# Patient Record
Sex: Female | Born: 1965 | Race: Black or African American | Hispanic: No | Marital: Married | Smoking: Never smoker
Health system: Southern US, Community
[De-identification: ages and names within clinical notes are randomized; demographics above are authoritative.]

## PROBLEM LIST (undated history)

## (undated) DIAGNOSIS — I509 Heart failure, unspecified: Secondary | ICD-10-CM

## (undated) DIAGNOSIS — I429 Cardiomyopathy, unspecified: Secondary | ICD-10-CM

## (undated) DIAGNOSIS — E7800 Pure hypercholesterolemia, unspecified: Secondary | ICD-10-CM

## (undated) HISTORY — PX: ABDOMINAL HYSTERECTOMY: SHX81

## (undated) HISTORY — PX: TUBAL LIGATION: SHX77

---

## 1998-03-18 ENCOUNTER — Other Ambulatory Visit: Admission: RE | Admit: 1998-03-18 | Discharge: 1998-03-18 | Payer: Self-pay | Admitting: *Deleted

## 1999-08-24 ENCOUNTER — Other Ambulatory Visit: Admission: RE | Admit: 1999-08-24 | Discharge: 1999-08-24 | Payer: Self-pay | Admitting: *Deleted

## 2000-02-13 ENCOUNTER — Encounter: Payer: Self-pay | Admitting: Emergency Medicine

## 2000-02-13 ENCOUNTER — Emergency Department (HOSPITAL_COMMUNITY): Admission: EM | Admit: 2000-02-13 | Discharge: 2000-02-13 | Payer: Self-pay | Admitting: Emergency Medicine

## 2001-09-08 ENCOUNTER — Other Ambulatory Visit: Admission: RE | Admit: 2001-09-08 | Discharge: 2001-09-08 | Payer: Self-pay | Admitting: Obstetrics and Gynecology

## 2002-08-06 ENCOUNTER — Other Ambulatory Visit: Admission: RE | Admit: 2002-08-06 | Discharge: 2002-08-06 | Payer: Self-pay | Admitting: Obstetrics and Gynecology

## 2002-09-10 ENCOUNTER — Encounter: Admission: RE | Admit: 2002-09-10 | Discharge: 2002-09-10 | Payer: Self-pay | Admitting: Obstetrics and Gynecology

## 2002-09-10 ENCOUNTER — Encounter: Payer: Self-pay | Admitting: Obstetrics and Gynecology

## 2006-02-12 ENCOUNTER — Ambulatory Visit (HOSPITAL_COMMUNITY): Admission: RE | Admit: 2006-02-12 | Discharge: 2006-02-12 | Payer: Self-pay | Admitting: Obstetrics & Gynecology

## 2008-01-07 ENCOUNTER — Ambulatory Visit (HOSPITAL_COMMUNITY): Admission: RE | Admit: 2008-01-07 | Discharge: 2008-01-07 | Payer: Self-pay | Admitting: Obstetrics & Gynecology

## 2010-05-16 ENCOUNTER — Ambulatory Visit (HOSPITAL_COMMUNITY)
Admission: RE | Admit: 2010-05-16 | Discharge: 2010-05-16 | Payer: Self-pay | Source: Home / Self Care | Attending: Obstetrics & Gynecology | Admitting: Obstetrics & Gynecology

## 2010-06-21 ENCOUNTER — Encounter
Admission: RE | Admit: 2010-06-21 | Discharge: 2010-06-21 | Payer: Self-pay | Source: Home / Self Care | Attending: Obstetrics & Gynecology | Admitting: Obstetrics & Gynecology

## 2010-07-01 ENCOUNTER — Encounter: Payer: Self-pay | Admitting: Obstetrics & Gynecology

## 2010-09-07 ENCOUNTER — Other Ambulatory Visit: Payer: Self-pay | Admitting: Obstetrics & Gynecology

## 2010-09-07 ENCOUNTER — Encounter (HOSPITAL_COMMUNITY): Payer: BC Managed Care – PPO | Attending: Obstetrics & Gynecology

## 2010-09-07 DIAGNOSIS — D259 Leiomyoma of uterus, unspecified: Secondary | ICD-10-CM | POA: Insufficient documentation

## 2010-09-07 DIAGNOSIS — Z01812 Encounter for preprocedural laboratory examination: Secondary | ICD-10-CM | POA: Insufficient documentation

## 2010-09-07 DIAGNOSIS — Z01810 Encounter for preprocedural cardiovascular examination: Secondary | ICD-10-CM | POA: Insufficient documentation

## 2010-09-07 LAB — CBC
HCT: 36.2 % (ref 36.0–46.0)
MCH: 27.9 pg (ref 26.0–34.0)
MCV: 83.6 fL (ref 78.0–100.0)
Platelets: 188 10*3/uL (ref 150–400)
RDW: 13 % (ref 11.5–15.5)

## 2010-09-15 ENCOUNTER — Observation Stay (HOSPITAL_COMMUNITY)
Admission: RE | Admit: 2010-09-15 | Discharge: 2010-09-16 | Disposition: A | Discharge status: Home / Self Care | Payer: BC Managed Care – PPO | Source: Ambulatory Visit | Attending: Obstetrics & Gynecology | Admitting: Obstetrics & Gynecology

## 2010-09-15 ENCOUNTER — Other Ambulatory Visit: Payer: Self-pay | Admitting: Obstetrics & Gynecology

## 2010-09-15 DIAGNOSIS — N72 Inflammatory disease of cervix uteri: Secondary | ICD-10-CM | POA: Insufficient documentation

## 2010-09-15 DIAGNOSIS — Z01811 Encounter for preprocedural respiratory examination: Secondary | ICD-10-CM | POA: Insufficient documentation

## 2010-09-15 DIAGNOSIS — N946 Dysmenorrhea, unspecified: Secondary | ICD-10-CM | POA: Insufficient documentation

## 2010-09-15 DIAGNOSIS — Z01812 Encounter for preprocedural laboratory examination: Secondary | ICD-10-CM | POA: Insufficient documentation

## 2010-09-15 DIAGNOSIS — N949 Unspecified condition associated with female genital organs and menstrual cycle: Secondary | ICD-10-CM | POA: Insufficient documentation

## 2010-09-15 DIAGNOSIS — D259 Leiomyoma of uterus, unspecified: Principal | ICD-10-CM | POA: Insufficient documentation

## 2010-09-15 DIAGNOSIS — G43909 Migraine, unspecified, not intractable, without status migrainosus: Secondary | ICD-10-CM | POA: Insufficient documentation

## 2010-09-15 LAB — ABO/RH: ABO/RH(D): B POS

## 2010-09-15 LAB — TYPE AND SCREEN
ABO/RH(D): B POS
Antibody Screen: NEGATIVE

## 2010-09-16 LAB — CBC
HCT: 31.1 % — ABNORMAL LOW (ref 36.0–46.0)
MCV: 83.8 fL (ref 78.0–100.0)
Platelets: 192 10*3/uL (ref 150–400)
RBC: 3.71 MIL/uL — ABNORMAL LOW (ref 3.87–5.11)
RDW: 13 % (ref 11.5–15.5)
WBC: 19.7 10*3/uL — ABNORMAL HIGH (ref 4.0–10.5)

## 2010-09-16 NOTE — Op Note (Signed)
Katie Crawford, ABREU NO.:  [id number]  MEDICAL RECORD NO.:  [id number]           PATIENT TYPE:  O  LOCATION:  1526                         FACILITY:  [facility]  PHYSICIAN:  Roseanna Rainbow, M.D.DATE OF BIRTH:  1965/06/21  DATE OF PROCEDURE:  09/15/2010 DATE OF DISCHARGE:                              OPERATIVE REPORT   PREOPERATIVE DIAGNOSIS:  Symptomatic uterine fibroids.  POSTOPERATIVE DIAGNOSIS:  Symptomatic uterine fibroids.  PROCEDURE:  Robotic-assisted total laparoscopic hysterectomy.  ANESTHESIA:  General endotracheal.  ESTIMATED BLOOD LOSS:  100 cc.  FLUIDS:  As per Anesthesiology.  URINE OUTPUT:  As per Anesthesiology.  COMPLICATIONS:  None.  SURGEON:  Roseanna Rainbow, MD, and Bing Neighbors. Clearance Coots, MD  FINDINGS:  On exam under anesthesia, the uterus was anteverted; the uterus sounded to 10 cm.  At the time of laparoscopy, there was an omental adhesion to the anterior abdominal wall.  There was a right-sided, lower uterine segment anterior myoma approximately 6 cm in diameter.  The fallopian tubes had been interrupted.  The ovaries were normal in appearance.  PROCEDURE IN DETAIL:  The patient was taken to the operating room and placed in the supine position with the arms tucked.  General anesthesia was then induced and she was placed in the dorsal supine position in Fowlkes stirrups with all the appropriate precautions.  Shoulder blocks were then placed.  The abdomen was prepped in the usual sterile fashion. A time-out was performed to confirm the patient, procedure, antibiotic, and allergy status.  The perineum and vagina were prepped with Betadine. A Foley catheter was placed in the vagina.  A sterile speculum was placed in the vagina.  The anterior lip of the cervix was then grasped with a single-tooth tenaculum.  The cervix was then dilated with [facility] dilators.  The ZUMI uterine manipulator with a medium colpotomizer ring was then  placed.  A pneumo-occluder was then placed over the uterine manipulator.  An OG tube was placed by Anesthesiology.  A 0.25% Marcaine was then injected 3 cm below the costal margin in the midclavicular line in the left upper quadrant.  Using a 5 mm Optiview, the abdomen was then entered under direct visualization.  The abdomen was then insufflated with CO2 gas.  The patient's abdominal pressures were appropriately maintained throughout the surgery.  She was then placed in steep Trendelenburg.  The incision sites were marked.  A 12 mm supraumbilical port was placed.  Bilateral 8 mm ports were placed 10 cm lateral from the camera port.  The left upper quadrant port was then removed and the incision was extended to accommodate a 10 mm trocar and sleeve which was advanced under direct visualization.  The above noted omental adhesion was divided with endoshears.   The robot was then docked in the usual fashion.  Starting on the patient's right, the round ligament was transected.  The anterior and posterior leaflets of the broad ligament were opened and a window was made in the broad ligament and a fallopian tube was cauterized and transected.  The utero-ovarian ligament was then cauterized and transected.  The bladder flap was created anteriorly from the patient's right  side.  The uterine vessels were then skeletonized and transected.  A C-loop was created in the usual fashion.  Attention was then turned to left side.  The round ligament was then transected with cautery.  The anterior and posterior leaflets of the broad ligament were opened, the bladder flap was then completed anteriorly.  A window was made in the broad ligament, fallopian tube was then cauterized, and transected.  The utero-ovarian ligament was then cauterized and transected.  Adequate hemostasis was noted.  The uterine vessels were skeletonized.  The uterine vessels were then coagulated with bipolar cautery and transected.   The pneumo- occluder balloon was then insufflated.  A C-loop on the patient's left side was then created in the usual fashion using monopolar cautery.  A colpotomy was started at 12 o'clock and continued around to 5 o'clock. The uterus was then deviated to the patient's right side.  The colpotomy was completed using monopolar cautery.  The uterus was then delivered into the vagina.  A suture cut needle guide was then inserted and using 0 Vicryl suture, the vaginal cuff was closed using a running suture. The abdomen and pelvis were then irrigated.  All the pedicles were inspected and were noted to be hemostatic.  The robot was undocked in the usual fashion.  The subcutaneous layers where the incisions were then reapproximated using figure-of-eight sutures of 0 Vicryl on a UR-6 needle.  The skin incisions were then closed using subcuticular sutures of 3-0 Monocryl.  The pneumo-occluder balloon was removed from the vagina.  Sponge stick was used to swab the vagina.  There was no active bleeding noted.  All the instrument and needle counts were correct x2 including the colpotomy ring.  The patient tolerated the procedure well and was taken to the PACU awake and in stable condition.     Roseanna Rainbow, M.D.     Judee Clara  D:  09/15/2010  T:  09/16/2010  Job:  [id number]  Electronically Signed by Antionette Char M.D. on 09/16/2010 04:54:09 PM

## 2010-09-16 NOTE — H&P (Signed)
  NAMEMarland Kitchen  SWAN, [name] NO.:  [id number]  MEDICAL RECORD NO.:  [id number]           PATIENT TYPE:  LOCATION:                                 FACILITY:  PHYSICIAN:  Katie Crawford, M.D.DATE OF BIRTH:  Feb 06, 1966  DATE OF ADMISSION: DATE OF DISCHARGE:                             HISTORY & PHYSICAL   CHIEF COMPLAINTS:  The patient is a 45 year old with symptomatic uterine fibroids and dysmenorrhea who presents for a total robotic assisted hysterectomy.  HISTORY OF PRESENT ILLNESS:  The patient complains of pain with intercourse associated with deep penetration.  Workup today has included a pelvic ultrasound that showed uterus size of length of 9.68 cm and multiple small fibroids, the largest fibroid is right sided, 5 cm in diameter.  A recent Pap smear was negative in December of 2011.  The patient also had a consult with Interventional Radiology for possible uterine artery embolization.  The patient has a history of irregular menses.   Workup to date has included a CBC, complete metabolic panel, estradiol which was 26.2, FSH which was 36.4.  The prolactin was normal.  PAST MEDICAL HISTORY:  No significant history of medical diseases.  PAST SURGICAL HISTORY:  There is a history of tubal ligation.  PAST OBSTETRICAL HISTORY:  There are 3 living children.  C/D x 2.  SOCIAL HISTORY:  She is married, living with her spouse.  She is a day Occupational hygienist.  There is no significant smoking history.  Does not give any significant history of alcohol usage.  Denies illicit drug use.  FAMILY HISTORY:  No major illnesses known.  MEDICATIONS:  Ultram.  REVIEW OF SYSTEMS:  GU:  Please see the above.  PHYSICAL EXAMINATION:  VITAL SIGNS:  Temperature 98.1, pulse 71, blood pressure 128/84.  Weight 171 pounds. GENERAL:  Well-developed, well-nourished African-American female, in no apparent distress. NECK:  Supple. LUNGS:  Clear to auscultation bilaterally. HEART:   Regular rate and rhythm. ABDOMEN:  No organomegaly. PELVIC EXAM:  Normal exit.  Speculum exam, the vagina is clear without lesions.  The cervix is clear, firm and closed.  No visible leakage.  On bimanual exam, uterus is mildly enlarged.  Position is anteverted, shape is irregular, about 10 week aggregate size.  Adnexa are without any masses and mildly tender.  ASSESSMENT:  Symptomatic uterine fibroid, perimenopausal patient with irregular menses, pelvic pain.  PLAN: The planned procedure is a total robotic assisted hysterectomy with ovarian conservation.  The risks, benefits, and alternative forms of the management were reviewed with the patient including but not limited to risk of infection, hemorrhage, inadvertent injury to the abdominopelvic viscera and abdominal and bowel complications.     Katie Crawford, M.D.     Katie Crawford  D:  09/13/2010  T:  09/14/2010  Job:  [id number]  Electronically Signed by Antionette Char M.D. on 09/16/2010 02:06:48 PM

## 2011-05-14 ENCOUNTER — Other Ambulatory Visit: Payer: Self-pay | Admitting: Obstetrics & Gynecology

## 2011-05-14 DIAGNOSIS — Z1231 Encounter for screening mammogram for malignant neoplasm of breast: Secondary | ICD-10-CM

## 2011-05-18 ENCOUNTER — Ambulatory Visit
Admission: RE | Admit: 2011-05-18 | Discharge: 2011-05-18 | Disposition: A | Discharge status: Home / Self Care | Payer: BC Managed Care – PPO | Source: Ambulatory Visit | Attending: Obstetrics & Gynecology | Admitting: Obstetrics & Gynecology

## 2011-05-18 DIAGNOSIS — Z1231 Encounter for screening mammogram for malignant neoplasm of breast: Secondary | ICD-10-CM

## 2011-11-23 ENCOUNTER — Encounter (HOSPITAL_COMMUNITY): Payer: Self-pay | Admitting: Family Medicine

## 2011-11-23 ENCOUNTER — Observation Stay (HOSPITAL_COMMUNITY)
Admission: EM | Admit: 2011-11-23 | Discharge: 2011-11-25 | DRG: 143 | Disposition: A | Discharge status: Home / Self Care | Payer: BC Managed Care – PPO | Attending: Internal Medicine | Admitting: Internal Medicine

## 2011-11-23 DIAGNOSIS — R079 Chest pain, unspecified: Secondary | ICD-10-CM | POA: Diagnosis present

## 2011-11-23 DIAGNOSIS — Z79890 Hormone replacement therapy (postmenopausal): Secondary | ICD-10-CM

## 2011-11-23 DIAGNOSIS — E7800 Pure hypercholesterolemia, unspecified: Secondary | ICD-10-CM | POA: Insufficient documentation

## 2011-11-23 DIAGNOSIS — R0789 Other chest pain: Secondary | ICD-10-CM | POA: Insufficient documentation

## 2011-11-23 DIAGNOSIS — R071 Chest pain on breathing: Principal | ICD-10-CM | POA: Insufficient documentation

## 2011-11-23 HISTORY — DX: Heart failure, unspecified: I50.9

## 2011-11-23 HISTORY — DX: Cardiomyopathy, unspecified: I42.9

## 2011-11-23 HISTORY — DX: Pure hypercholesterolemia, unspecified: E78.00

## 2011-11-23 LAB — CBC
HCT: 37.5 % (ref 36.0–46.0)
HCT: 35.7 % — ABNORMAL LOW (ref 36.0–46.0)
Hemoglobin: 12.9 g/dL (ref 12.0–15.0)
Hemoglobin: 12.3 g/dL (ref 12.0–15.0)
MCH: 28.5 pg (ref 26.0–34.0)
MCH: 28.8 pg (ref 26.0–34.0)
MCHC: 34.4 g/dL (ref 30.0–36.0)
MCHC: 34.5 g/dL (ref 30.0–36.0)
MCV: 82.8 fL (ref 78.0–100.0)
MCV: 83.6 fL (ref 78.0–100.0)
Platelets: 210 10*3/uL (ref 150–400)
Platelets: 190 10*3/uL (ref 150–400)
RBC: 4.53 MIL/uL (ref 3.87–5.11)
RBC: 4.27 MIL/uL (ref 3.87–5.11)
RDW: 12.6 % (ref 11.5–15.5)
RDW: 12.9 % (ref 11.5–15.5)
WBC: 9.7 10*3/uL (ref 4.0–10.5)
WBC: 8.7 10*3/uL (ref 4.0–10.5)

## 2011-11-23 LAB — BASIC METABOLIC PANEL
BUN: 13 mg/dL (ref 6–23)
CO2: 26 mEq/L (ref 19–32)
Calcium: 9.9 mg/dL (ref 8.4–10.5)
Chloride: 101 mEq/L (ref 96–112)
Creatinine, Ser: 0.72 mg/dL (ref 0.50–1.10)
GFR calc Af Amer: >90 mL/min (ref >90)
GFR calc non Af Amer: >90 mL/min (ref >90)
Glucose, Bld: 120 mg/dL — ABNORMAL HIGH (ref 70–99)
Potassium: 3.5 mEq/L (ref 3.5–5.1)
Sodium: 137 mEq/L (ref 135–145)

## 2011-11-23 LAB — POCT I-STAT TROPONIN I: Troponin i, poc: 0 ng/mL (ref 0.00–0.08)

## 2011-11-23 LAB — CREATININE, SERUM
Creatinine, Ser: 0.72 mg/dL (ref 0.50–1.10)
GFR calc Af Amer: >90 mL/min (ref >90)
GFR calc non Af Amer: >90 mL/min (ref >90)

## 2011-11-23 LAB — CARDIAC PANEL(CRET KIN+CKTOT+MB+TROPI): Total CK: 146 U/L (ref 7–177)

## 2011-11-23 LAB — PRO B NATRIURETIC PEPTIDE: Pro B Natriuretic peptide (BNP): 18.9 pg/mL (ref 0–125)

## 2011-11-23 MED ORDER — ACETAMINOPHEN 650 MG RE SUPP: 650.0000 mg | Freq: Four times a day (QID) | RECTAL | Status: DC | PRN

## 2011-11-23 MED ORDER — ATORVASTATIN CALCIUM 10 MG PO TABS
10.0000 mg | ORAL_TABLET | Freq: Every day | ORAL | Status: DC
  Administered 2011-11-24 – 2011-11-25 (×2): 10 mg via ORAL
  Filled 2011-11-23 (×2): qty 1

## 2011-11-23 MED ORDER — MORPHINE SULFATE 2 MG/ML IJ SOLN
1.0000 mg | INTRAMUSCULAR | Status: DC | PRN
  Administered 2011-11-23: 1 mg via INTRAVENOUS
  Filled 2011-11-23: qty 1

## 2011-11-23 MED ORDER — ACETAMINOPHEN 325 MG PO TABS
650.0000 mg | ORAL_TABLET | Freq: Four times a day (QID) | ORAL | Status: DC | PRN
  Administered 2011-11-24 (×2): 650 mg via ORAL
  Filled 2011-11-23 (×2): qty 2

## 2011-11-23 MED ORDER — ONDANSETRON HCL 4 MG/2ML IJ SOLN: 4.0000 mg | Freq: Four times a day (QID) | INTRAMUSCULAR | Status: DC | PRN

## 2011-11-23 MED ORDER — NITROGLYCERIN 0.3 MG SL SUBL
0.3000 mg | SUBLINGUAL_TABLET | SUBLINGUAL | Status: DC | PRN
  Filled 2011-11-23: qty 100

## 2011-11-23 MED ORDER — MORPHINE SULFATE 2 MG/ML IJ SOLN
1.0000 mg | INTRAMUSCULAR | Status: DC | PRN
  Administered 2011-11-24 – 2011-11-25 (×6): 1 mg via INTRAVENOUS
  Filled 2011-11-23 (×6): qty 1

## 2011-11-23 MED ORDER — ENOXAPARIN SODIUM 40 MG/0.4ML ~~LOC~~ SOLN
40.0000 mg | SUBCUTANEOUS | Status: DC
  Administered 2011-11-23 – 2011-11-24 (×2): 40 mg via SUBCUTANEOUS
  Filled 2011-11-23 (×3): qty 0.4

## 2011-11-23 MED ORDER — NITROGLYCERIN 2 % TD OINT
0.5000 [in_us] | TOPICAL_OINTMENT | Freq: Once | TRANSDERMAL | Status: AC
  Administered 2011-11-23: 0.5 [in_us] via TOPICAL
  Filled 2011-11-23: qty 30

## 2011-11-23 MED ORDER — NITROGLYCERIN 0.4 MG SL SUBL: 0.4000 mg | SUBLINGUAL_TABLET | SUBLINGUAL | Status: DC | PRN

## 2011-11-23 MED ORDER — SODIUM CHLORIDE 0.9 % IJ SOLN
3.0000 mL | Freq: Two times a day (BID) | INTRAMUSCULAR | Status: DC
  Administered 2011-11-23 – 2011-11-25 (×4): 3 mL via INTRAVENOUS

## 2011-11-23 MED ORDER — SODIUM CHLORIDE 0.9 % IV SOLN: 250.0000 mL | INTRAVENOUS | Status: DC | PRN

## 2011-11-23 MED ORDER — ONDANSETRON HCL 4 MG PO TABS: 4.0000 mg | ORAL_TABLET | Freq: Four times a day (QID) | ORAL | Status: DC | PRN

## 2011-11-23 MED ORDER — SODIUM CHLORIDE 0.9 % IJ SOLN
3.0000 mL | Freq: Two times a day (BID) | INTRAMUSCULAR | Status: DC
  Administered 2011-11-24 (×2): 3 mL via INTRAVENOUS

## 2011-11-23 MED ORDER — PANTOPRAZOLE SODIUM 40 MG PO TBEC
40.0000 mg | DELAYED_RELEASE_TABLET | Freq: Every day | ORAL | Status: DC
  Administered 2011-11-24 – 2011-11-25 (×2): 40 mg via ORAL
  Filled 2011-11-23 (×2): qty 1

## 2011-11-23 MED ORDER — SODIUM CHLORIDE 0.9 % IJ SOLN: 3.0000 mL | INTRAMUSCULAR | Status: DC | PRN

## 2011-11-23 MED ORDER — ASPIRIN 81 MG PO CHEW
324.0000 mg | CHEWABLE_TABLET | Freq: Once | ORAL | Status: AC
  Administered 2011-11-23: 324 mg via ORAL
  Filled 2011-11-23: qty 4

## 2011-11-23 NOTE — H&P (Signed)
Hospital Admission Note Date: 11/23/2011  Patient name: Katie Crawford Medical record number: [id number] Date of birth: Feb 07, 1966 Age: 46 y.o. Gender: female PCP: Egbert Garibaldi, NP  Attending physician: Marthann Schiller, MD  Chief Complaint: Right-sided pleuritic chest pain times one day.  History of Present Illness: Patient is a very pleasant 46 year old female with a history of postpartum cardiomyopathy approximately 6 years ago. This morning the patient had a sudden onset of right-sided chest pain associated with nausea. She states that she laid down and then placed her knees between her legs and the sensation appear to resolve.   Patient describes the pain as being a right-sided chest pressure which radiates to the area of the sternum. She is unable to quantify the intensity of the pain. She states that this episode lasted for about 5 minutes. The pain is worse when she takes a deep breath and she has no palliative features that she can identify. There was no associated diaphoresis or dizziness at this episode.   The patient states that she's been having some episodes of intermittent dizziness upon standing for several months and has been having nausea since starting estrogen replacement in September of last year. However she had no dizziness during the episode this morning.   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 She states that the pressure on the right side which has continued and is still present at this time. She also stated that taking a deep breath causes her to have increased pain on the right side. She is very concerned about the fact that this may be a reappearance of her cardiomyopathy so she came to the hospital for further evaluation and management.   The patient denies any shortness of breath, dyspnea on exertion, syncope, fever, chills, vomiting, diarrhea.  Scheduled Meds:   . aspirin   324 mg Oral Once  . nitroGLYCERIN  0.5 inch Topical Once   Continuous Infusions:  PRN Meds:.morphine injection, nitroGLYCERIN Allergies: Review of patient's allergies indicates no known allergies. Past Medical History  Diagnosis Date  . CHF (congestive heart failure)   . High cholesterol   . Cardiomyopathy    Past Surgical History  Procedure Date  . Abdominal hysterectomy    History reviewed. No pertinent family history. History   Social History  . Marital Status: Married    Spouse Name: N/A    Number of Children: N/A  . Years of Education: N/A   Occupational History  . Not on file.   Social History Main Topics  . Smoking status: Never Smoker   . Smokeless tobacco: Not on file  . Alcohol Use: Yes     occasionally  . Drug Use: No  . Sexually Active:    Other Topics Concern  . Not on file   Social History Narrative  . No narrative on file   Review of Systems: A comprehensive review of systems was negative except for elements noted in the history of present illness and a recent sprain of her left ankle.  Physical Exam: No intake or output data in the 24 hours ending 11/23/11 1947 General: Alert, awake, oriented x3, in no acute distress.  HEENT: [place]/AT PEERL, EOMI Neck: Trachea midline,  no masses, no thyromegal,y no JVD, no carotid bruit OROPHARYNX:  Moist, No exudate/ erythema/lesions.  Heart: Regular rate and rhythm, without murmurs, rubs, gallops, PMI non-displaced, no heaves or thrills on palpation.  Lungs: Clear to  auscultation, no wheezing or rhonchi noted. The patient does complain of pain on the right side of her chest occurring with deep breaths Abdomen: Soft, nontender, nondistended, positive bowel sounds, no masses no hepatosplenomegaly noted..  Neuro: No focal neurological deficits noted cranial nerves II through XII grossly intact. DTRs 2+ bilaterally upper and lower extremities. Strength functional in bilateral upper and lower  extremities. Musculoskeletal: No warm swelling or erythema around joints, no spinal tenderness noted. Psychiatric: Patient alert and oriented x3, good insight and cognition, good recent to remote recall. Lymph node survey: No cervical axillary or inguinal lymphadenopathy noted.  Lab results:  [facility] 11/23/11 1611  NA 137  K 3.5  CL 101  CO2 26  GLUCOSE 120*  BUN 13  CREATININE 0.72  CALCIUM 9.9  MG --  PHOS --   No results found for this basename: AST:2,ALT:2,ALKPHOS:2,BILITOT:2,PROT:2,ALBUMIN:2 in the last 72 hours No results found for this basename: LIPASE:2,AMYLASE:2 in the last 72 hours  Basename 11/23/11 1611  WBC 9.7  NEUTROABS --  HGB 12.9  HCT 37.5  MCV 82.8  PLT 210   No results found for this basename: CKTOTAL:3,CKMB:3,CKMBINDEX:3,TROPONINI:3 in the last 72 hours No components found with this basename: POCBNP:3 No results found for this basename: DDIMER:2 in the last 72 hours No results found for this basename: HGBA1C:2 in the last 72 hours No results found for this basename: CHOL:2,HDL:2,LDLCALC:2,TRIG:2,CHOLHDL:2,LDLDIRECT:2 in the last 72 hours No results found for this basename: TSH,T4TOTAL,FREET3,T3FREE,THYROIDAB in the last 72 hours No results found for this basename: VITAMINB12:2,FOLATE:2,FERRITIN:2,TIBC:2,IRON:2,RETICCTPCT:2 in the last 72 hours Imaging results:  No results found. Other results: EKG:, ischemic changes noted in leads V3.   Patient Active Hospital Problem List: No active hospital problems. 1. chest pain: The patient presents with right-sided chest pain. Her pain is atypical for cardiac type pain however she is on estrogen replacement and she is complaining of pleuritic chest pain on the right side. Thus the presence of a pulmonary embolus needs to be ruled out on this patient. I have ordered a d-dimer and awaiting the results.  Although her chest and this is atypical for cardiac type pain her EKG does show inverted T waves in V3 which  were not present in 2012. Thus the patient will have her enzymes cycled and I will order 2-D echocardiogram in light of the fact that the patient has had a history of postpartum cardiomyopathy. If these all prove to be negative it may be beneficial for the patient to have a stress test performed prior to her discharge.  The patient is being brought observation basis to telemetry bed. Total time spent with this patient in assessment and examination approximately 42 minutes.  Tamelia Michalowski A. 11/23/2011, 7:47 PM

## 2011-11-23 NOTE — ED Notes (Signed)
Pt reports centralized chest pain with nausea and sob starting today around 07:30.

## 2011-11-24 DIAGNOSIS — Z79890 Hormone replacement therapy (postmenopausal): Secondary | ICD-10-CM

## 2011-11-24 DIAGNOSIS — R079 Chest pain, unspecified: Secondary | ICD-10-CM

## 2011-11-24 DIAGNOSIS — R51 Headache: Secondary | ICD-10-CM

## 2011-11-24 LAB — CARDIAC PANEL(CRET KIN+CKTOT+MB+TROPI): Relative Index: 1.9 (ref 0.0–2.5)

## 2011-11-24 LAB — LIPID PANEL: Cholesterol: 202 mg/dL — ABNORMAL HIGH (ref 0–200)

## 2011-11-24 LAB — HEMOGLOBIN A1C: Mean Plasma Glucose: 114 mg/dL (ref <117)

## 2011-11-24 LAB — TSH: TSH: 1.992 u[IU]/mL (ref 0.350–4.500)

## 2011-11-24 MED ORDER — HYDROCODONE-ACETAMINOPHEN 5-325 MG PO TABS
1.0000 | ORAL_TABLET | ORAL | Status: DC | PRN
  Administered 2011-11-24 – 2011-11-25 (×3): 2 via ORAL
  Filled 2011-11-24 (×3): qty 2

## 2011-11-24 MED ORDER — ASPIRIN-ACETAMINOPHEN-CAFFEINE 250-250-65 MG PO TABS
1.0000 | ORAL_TABLET | Freq: Four times a day (QID) | ORAL | Status: DC | PRN
  Administered 2011-11-24: 1 via ORAL
  Filled 2011-11-24: qty 1

## 2011-11-24 NOTE — Progress Notes (Signed)
Patient had 4 beats of V-tach last night around 01:10.  Patient was asymptomatic and resting in bed. Vitals were recorded and MD on call was notified. Labs were ordered.  Will continue to monitor patient. Katie Crawford, Katie Crawford

## 2011-11-24 NOTE — Progress Notes (Signed)
[facility] PROGRESS NOTE  AANVI Crawford Katie Crawford:[id number] DOB: 1965/09/02 DOA: 11/23/2011 PCP: Egbert Garibaldi, NP  Assessment/Plan: 1. chest pain: The patient presents with right-sided chest pain. Her pain is atypical for cardiac type pain however she is on estrogen replacement and she is complaining of pleuritic chest pain on the right side. PE ruled out with negative d-dimer Although her chest and this is atypical for cardiac type pain her EKG does show inverted T waves in V3 which were not present in 2012. Thus the patient will have her enzymes cycled and 2-D echocardiogram in light of the fact that the patient has had a history of postpartum cardiomyopathy.   Code Status: full Family Communication: none Disposition Plan: home once results back and work up complete  Marlin Canary, D.O.  [facility] Pager [phone]  11/24/2011, 10:50 AM  LOS: 1 day     Subjective: C/o headache Chest pain resolved   Objective: Filed Vitals:   11/23/11 2250 11/23/11 2251 11/24/11 0118 11/24/11 0518  BP: 112/77 103/69 98/62 102/66  Pulse: 81 84 77 56  Temp:    97.6 F (36.4 C)  TempSrc:    Oral  Resp: 18 18  18   Height:      Weight:      SpO2: 99% 97%  98%    Intake/Output Summary (Last 24 hours) at 11/24/11 1050 Last data filed at 11/24/11 0658  Gross per 24 hour  Intake      0 ml  Output    300 ml  Net   -300 ml    Exam:  General: Alert, awake, oriented x3, in no acute distress.  Heart: Regular rate and rhythm, without murmurs, rubs, gallops, PMI non-displaced, no heaves or thrills on palpation.  Lungs: Clear to auscultation, no wheezing or rhonchi noted. The patient does complain of pain on the right side of her chest occurring with deep breaths  Abdomen: Soft, nontender, nondistended, positive bowel sounds, no masses no hepatosplenomegaly noted..  Neuro: No focal neurological deficits noted cranial nerves II through XII grossly intact.  DTRs 2+ bilaterally upper and lower extremities. Strength functional in bilateral upper and lower extremities.  Musculoskeletal: No warm swelling or erythema around joints, no spinal tenderness noted.     Data Reviewed: Basic Metabolic Panel:  Lab 11/24/11 8657 11/23/11 2210 11/23/11 1611  NA -- -- 137  K -- -- 3.5  CL -- -- 101  CO2 -- -- 26  GLUCOSE -- -- 120*  BUN -- -- 13  CREATININE -- 0.72 0.72  CALCIUM -- -- 9.9  MG 2.0 -- --  PHOS -- -- --   Liver Function Tests: No results found for this basename: AST:5,ALT:5,ALKPHOS:5,BILITOT:5,PROT:5,ALBUMIN:5 in the last 168 hours No results found for this basename: LIPASE:5,AMYLASE:5 in the last 168 hours No results found for this basename: AMMONIA:5 in the last 168 hours CBC:  Lab 11/23/11 2210 11/23/11 1611  WBC 8.7 9.7  NEUTROABS -- --  HGB 12.3 12.9  HCT 35.7* 37.5  MCV 83.6 82.8  PLT 190 210   Cardiac Enzymes:  Lab 11/24/11 0515 11/23/11 2210  CKTOTAL 120 146  CKMB 2.3 2.7  CKMBINDEX -- --  TROPONINI <0.30 <0.30   BNP: No components found with this basename: POCBNP:5 CBG: No results found for this basename: GLUCAP:5 in the last 168 hours  No results found for this or any previous visit (from the past 240 hour(s)).   Studies: No results found.  Scheduled Meds:   .  aspirin  324 mg Oral Once  . atorvastatin  10 mg Oral q1800  . enoxaparin  40 mg Subcutaneous Q24H  . nitroGLYCERIN  0.5 inch Topical Once  . pantoprazole  40 mg Oral Q0600  . sodium chloride  3 mL Intravenous Q12H  . sodium chloride  3 mL Intravenous Q12H   Continuous Infusions:

## 2011-11-25 DIAGNOSIS — Z79890 Hormone replacement therapy (postmenopausal): Secondary | ICD-10-CM

## 2011-11-25 DIAGNOSIS — R079 Chest pain, unspecified: Secondary | ICD-10-CM

## 2011-11-25 DIAGNOSIS — R51 Headache: Secondary | ICD-10-CM

## 2011-11-25 DIAGNOSIS — I059 Rheumatic mitral valve disease, unspecified: Secondary | ICD-10-CM

## 2011-11-25 MED ORDER — PANTOPRAZOLE SODIUM 40 MG PO TBEC: 40.0000 mg | DELAYED_RELEASE_TABLET | Freq: Every day | ORAL | Status: DC

## 2011-11-25 MED ORDER — HYDROCODONE-ACETAMINOPHEN 5-325 MG PO TABS: 1.0000 | ORAL_TABLET | ORAL | Status: AC | PRN

## 2011-11-25 MED ORDER — ATORVASTATIN CALCIUM 10 MG PO TABS: 10.0000 mg | ORAL_TABLET | Freq: Every day | ORAL | Status: DC

## 2011-11-25 NOTE — Progress Notes (Signed)
[facility] PROGRESS NOTE  Katie Crawford ZOX:[id number] DOB: [birth year] DOA: 11/23/2011 PCP: Egbert Garibaldi, NP  Assessment/Plan: 1. chest pain: The patient presents with right-sided chest pain. Her pain is atypical for cardiac type pain however she is on estrogen replacement and she is complaining of pleuritic chest pain on the right side. PE ruled out with negative d-dimer, chest pain is reproducible with palpation Although her chest and this is atypical for cardiac type pain her EKG does show inverted T waves in V3 which were not present in 2012. Thus the patient will have her enzymes cycled and 2-D echocardiogram in light of the fact that the patient has had a history of postpartum cardiomyopathy.   Code Status: full Family Communication: none Disposition Plan: home once results back and work up complete  Marlin Canary, D.O.  [facility] Pager [phone]  11/25/2011, 10:22 AM  LOS: 2 days     Subjective: Head ache better but sharp chest pain and right shoulder pain No nausea No vomiting   Objective: Filed Vitals:   11/24/11 0118 11/24/11 0518 11/24/11 1430 11/24/11 2210  BP: 98/62 102/66 108/73 111/74  Pulse: 77 56 57 68  Temp:  97.6 F (36.4 C) 98.2 F (36.8 C) 97.8 F (36.6 C)  TempSrc:  Oral Oral Oral  Resp:  18 18 18   Height:      Weight:      SpO2:  98% 99% 99%    Intake/Output Summary (Last 24 hours) at 11/25/11 1022 Last data filed at 11/24/11 1900  Gross per 24 hour  Intake    480 ml  Output    300 ml  Net    180 ml    Exam:  General: Alert, awake, oriented x3, in no acute distress.  Heart: Regular rate and rhythm, without murmurs, rubs, gallops, PMI non-displaced, no heaves or thrills on palpation.  Lungs: Clear to auscultation, no wheezing or rhonchi noted. The patient does complain of pain on the right side of her chest occurring with deep breaths  Abdomen: Soft, nontender, nondistended, positive bowel  sounds, no masses no hepatosplenomegaly noted..  Neuro: No focal neurological deficits noted cranial nerves II through XII grossly intact. DTRs 2+ bilaterally upper and lower extremities. Strength functional in bilateral upper and lower extremities.  Musculoskeletal: No warm swelling or erythema around joints, no spinal tenderness noted.     Data Reviewed: Basic Metabolic Panel:  Lab 11/24/11 8295 11/23/11 2210 11/23/11 1611  NA -- -- 137  K -- -- 3.5  CL -- -- 101  CO2 -- -- 26  GLUCOSE -- -- 120*  BUN -- -- 13  CREATININE -- 0.72 0.72  CALCIUM -- -- 9.9  MG 2.0 -- --  PHOS -- -- --   Liver Function Tests: No results found for this basename: AST:5,ALT:5,ALKPHOS:5,BILITOT:5,PROT:5,ALBUMIN:5 in the last 168 hours No results found for this basename: LIPASE:5,AMYLASE:5 in the last 168 hours No results found for this basename: AMMONIA:5 in the last 168 hours CBC:  Lab 11/23/11 2210 11/23/11 1611  WBC 8.7 9.7  NEUTROABS -- --  HGB 12.3 12.9  HCT 35.7* 37.5  MCV 83.6 82.8  PLT 190 210   Cardiac Enzymes:  Lab 11/24/11 1323 11/24/11 0515 11/23/11 2210  CKTOTAL 195* 120 146  CKMB 2.8 2.3 2.7  CKMBINDEX -- -- --  TROPONINI <0.30 <0.30 <0.30   BNP: No components found with this basename: POCBNP:5 CBG: No results found for this basename: GLUCAP:5 in the last 168  hours  No results found for this or any previous visit (from the past 240 hour(s)).   Studies: No results found.  Scheduled Meds:    . atorvastatin  10 mg Oral q1800  . enoxaparin  40 mg Subcutaneous Q24H  . pantoprazole  40 mg Oral Q0600  . sodium chloride  3 mL Intravenous Q12H  . sodium chloride  3 mL Intravenous Q12H   Continuous Infusions:

## 2011-11-25 NOTE — Progress Notes (Signed)
  Echocardiogram 2D Echocardiogram has been performed.  Katie Crawford 11/25/2011, 10:08 AM

## 2011-11-25 NOTE — Progress Notes (Signed)
PT. HAS SOME CHEST PAIN IN THE MIDDLE OF HER CHEST RATING THE PAIN AS A 7 PRESSURE. VS ARE STABLE, EKG IS DONE, MORPHINE GIVEN NOTIFIED DR. VANN,  WILL CONTINUE TO MONITOR AND OBSERVE PT.

## 2011-11-25 NOTE — Discharge Summary (Signed)
Discharge Summary  Katie Crawford MR#: [id number]  DOB:Jun 24, 1965  Date of Admission: 11/23/2011 Date of Discharge: 11/25/2011  Patient's PCP: Egbert Garibaldi, NP  Attending Physician:Mushka Laconte   Discharge Diagnoses: Active Problems:  Chest pain  Postmenopausal HRT (hormone replacement therapy)   Brief Admitting History and Physical Patient is a very pleasant 46 year old female with a history of postpartum cardiomyopathy approximately 6 years ago. This morning the patient had a sudden onset of right-sided chest pain associated with nausea. She states that she laid down and then placed her knees between her legs and the sensation appear to resolve.  Patient describes the pain as being a right-sided chest pressure which radiates to the area of the sternum. She is unable to quantify the intensity of the pain. She states that this episode lasted for about 5 minutes. The pain is worse when she takes a deep breath and she has no palliative features that she can identify. There was no associated diaphoresis or dizziness at this episode.  The patient states that she's been having some episodes of intermittent dizziness upon standing for several months and has been having nausea since starting estrogen replacement in September of last year. However she had no dizziness during the episode this morning.  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 She states that the pressure on the right side which has continued and is still present at this time. She also stated that taking a deep breath causes her to have increased pain on the right side. She is very concerned about the fact that this may be a reappearance of her cardiomyopathy so she came to the hospital for further evaluation and management.  The patient denies any shortness of breath, dyspnea on exertion, syncope, fever, chills, vomiting, diarrhea.   Discharge  Medications Medication List  As of 11/25/2011  5:47 PM   STOP taking these medications         traMADol 50 MG tablet         TAKE these medications         aspirin-acetaminophen-caffeine 250-250-65 MG per tablet   Commonly known as: EXCEDRIN MIGRAINE   Take 1 tablet by mouth every 6 (six) hours as needed. Migraines      atorvastatin 10 MG tablet   Commonly known as: LIPITOR   Take 1 tablet (10 mg total) by mouth daily at 6 PM.      estradiol 0.1 mg/24hr   Commonly known as: CLIMARA - Dosed in mg/24 hr   Place 1 patch onto the skin once a week.      fluconazole 50 MG tablet   Commonly known as: DIFLUCAN   Take 50 mg by mouth daily.      HYDROcodone-acetaminophen 5-325 MG per tablet   Commonly known as: NORCO   Take 1-2 tablets by mouth every 4 (four) hours as needed.      pantoprazole 40 MG tablet   Commonly known as: PROTONIX   Take 1 tablet (40 mg total) by mouth daily at 6 (six) AM.            Hospital Course: Chest pain- echo ok, CE negative, pain reproducible with palpation- probably not cardiac but could benefit from outpatient stress test will defer to PCP HLD- try diet and exercise   Day of Discharge BP 126/79  Pulse 65  Temp 97.5 F (36.4 C) (Oral)  Resp 16  Ht 5'  5" (1.651 m)  Wt 80.9 kg (178 lb 5.6 oz)  BMI 29.68 kg/m2  SpO2 99% A+Ox3 NAD -c/c/e +BS, soft, NT Clear ant   Results for orders placed during the hospital encounter of 11/23/11 (from the past 48 hour(s))  CBC     Status: Abnormal   Collection Time   11/23/11 10:10 PM      Component Value Range Comment   WBC 8.7  4.0 - 10.5 K/uL    RBC 4.27  3.87 - 5.11 MIL/uL    Hemoglobin 12.3  12.0 - 15.0 g/dL    HCT 40.9 (*) 81.1 - 46.0 %    MCV 83.6  78.0 - 100.0 fL    MCH 28.8  26.0 - 34.0 pg    MCHC 34.5  30.0 - 36.0 g/dL    RDW 91.4  78.2 - 95.6 %    Platelets 190  150 - 400 K/uL   CREATININE, SERUM     Status: Normal   Collection Time   11/23/11 10:10 PM      Component Value Range  Comment   Creatinine, Ser 0.72  0.50 - 1.10 mg/dL    GFR calc non Af Amer >90  >90 mL/min    GFR calc Af Amer >90  >90 mL/min   CARDIAC PANEL(CRET KIN+CKTOT+MB+TROPI)     Status: Normal   Collection Time   11/23/11 10:10 PM      Component Value Range Comment   Total CK 146  7 - 177 U/L    CK, MB 2.7  0.3 - 4.0 ng/mL    Troponin I <0.30  <0.30 ng/mL    Relative Index 1.8  0.0 - 2.5   TSH     Status: Normal   Collection Time   11/23/11 10:10 PM      Component Value Range Comment   TSH 1.992  0.350 - 4.500 uIU/mL   HEMOGLOBIN A1C     Status: Normal   Collection Time   11/23/11 10:10 PM      Component Value Range Comment   Hemoglobin A1C 5.6  <5.7 %    Mean Plasma Glucose 114  <117 mg/dL   CARDIAC PANEL(CRET KIN+CKTOT+MB+TROPI)     Status: Normal   Collection Time   11/24/11  5:15 AM      Component Value Range Comment   Total CK 120  7 - 177 U/L    CK, MB 2.3  0.3 - 4.0 ng/mL    Troponin I <0.30  <0.30 ng/mL    Relative Index 1.9  0.0 - 2.5   LIPID PANEL     Status: Abnormal   Collection Time   11/24/11  5:15 AM      Component Value Range Comment   Cholesterol 202 (*) 0 - 200 mg/dL    Triglycerides 213  <086 mg/dL    HDL 38 (*) >57 mg/dL    Total CHOL/HDL Ratio 5.3      VLDL 30  0 - 40 mg/dL    LDL Cholesterol 846 (*) 0 - 99 mg/dL   MAGNESIUM     Status: Normal   Collection Time   11/24/11  5:15 AM      Component Value Range Comment   Magnesium 2.0  1.5 - 2.5 mg/dL   CARDIAC PANEL(CRET KIN+CKTOT+MB+TROPI)     Status: Abnormal   Collection Time   11/24/11  1:23 PM      Component Value Range Comment   Total CK 195 (*) 7 -  177 U/L    CK, MB 2.8  0.3 - 4.0 ng/mL    Troponin I <0.30  <0.30 ng/mL    Relative Index 1.4  0.0 - 2.5    Study Conclusions  - Left ventricle: The cavity size was normal. Wall thickness was normal. Systolic function was normal. The estimated ejection fraction was in the range of 55% to 60%. Wall motion was normal; there were no regional wall  motion abnormalities. Left ventricular diastolic function parameters were normal. - Aortic valve: Trivial regurgitation. - Mitral valve: Mild regurgitation. - Atrial septum: No defect or patent foramen ovale was identified. - Pulmonary arteries: PA peak pressure: 27mm Hg (S). Transthoracic echocardiography. M-mode, complete 2D, spectral Doppler, and color Doppler. Height: Height: 165cm. Height: 65in. Weight: Weight: 80.9kg. Weight: 178lb. Body mass index: BMI: 29.7kg/m^2. Body surface area: BSA: 1.74m^2. Blood pressure: 111/74. Patient status: Inpatient. Location: Bedside.  ------------------------------------------------------------  ------------------------------------------------------------ Left ventricle: The cavity size was normal. Wall thickness was normal. Systolic function was normal. The estimated ejection fraction was in the range of 55% to 60%. Wall motion was normal; there were no regional wall motion abnormalities. The transmitral flow pattern was normal. The deceleration time of the early transmitral flow velocity was normal. The pulmonary vein flow pattern was normal. The tissue Doppler parameters were normal. Left ventricular diastolic function parameters were normal.  ------------------------------------------------------------ Aortic valve: Mildly thickened leaflets. Doppler: Trivial regurgitation.  ------------------------------------------------------------ Aorta: Aortic root: The aortic root was normal in size. Ascending aorta: The ascending aorta was normal in size.  ------------------------------------------------------------ Mitral valve: Mildly thickened leaflets . Doppler: Mild regurgitation. Peak gradient: 2mm Hg (D).  ------------------------------------------------------------ Left atrium: The atrium was at the upper limits of normal in size.  ------------------------------------------------------------ Atrial septum: No defect or patent  foramen ovale was identified.  ------------------------------------------------------------ Right ventricle: The cavity size was normal. Systolic function was normal.  ------------------------------------------------------------ Pulmonic valve: The valve appears to be grossly normal. Doppler: Mild regurgitation.  ------------------------------------------------------------ Tricuspid valve: Mildly thickened leaflets. Doppler: Mild regurgitation.  ------------------------------------------------------------ Pulmonary artery: Poorly visualized. The main pulmonary artery was normal-sized.  ------------------------------------------------------------ Right atrium: The atrium was normal in size.  ------------------------------------------------------------ Pericardium: There was no pericardial effusion.  ------------------------------------------------------------ Systemic veins: Inferior vena cava: The vessel was normal in size; the respirophasic diameter changes were in the normal range (= 50%); findings are consistent with normal central venous pressure.    No results found.   Disposition: home  Diet: cardiac  Activity: as tolerated   Follow-up Appts: Discharge Orders    Future Orders Please Complete By Expires   Diet - low sodium heart healthy      Increase activity slowly      Discharge instructions      Comments:   PCP for referral for outpatient stress test FLP/LFTs in 6 weeks      TESTS THAT NEED FOLLOW-UP Outpatient stress test   Time spent on discharge, talking to the patient, and coordinating care: 45 mins.   SignedMarlin Canary, DO 11/25/2011, 5:47 PM

## 2011-11-29 NOTE — ED Provider Notes (Signed)
History    46yf with CP. Substernal to R chest. Pressure. Onset this morning around 0730. Waxes and wanes but doesn't completely resolve. No fever or chills. Mild dyspnea initially which has since resolved. No n/v. No unusual leg pain or swelling. Exogenous estrogen use. No cough.   CSN: [id number]  Arrival date & time 11/23/11  1544   First MD Initiated Contact with Patient 11/23/11 1609      Chief Complaint  Patient presents with  . Chest Pain    (Consider location/radiation/quality/duration/timing/severity/associated sxs/prior treatment) HPI  Past Medical History  Diagnosis Date  . CHF (congestive heart failure)   . High cholesterol   . Cardiomyopathy     Past Surgical History  Procedure Date  . Abdominal hysterectomy     History reviewed. No pertinent family history.  History  Substance Use Topics  . Smoking status: Never Smoker   . Smokeless tobacco: Not on file  . Alcohol Use: Yes     occasionally    OB History    Grav Para Term Preterm Abortions TAB SAB Ect Mult Living                  Review of Systems   Review of symptoms negative unless otherwise noted in HPI.  Allergies  Review of patient's allergies indicates no known allergies.  Home Medications   Current Outpatient Rx  Name Route Sig Dispense Refill  . ASPIRIN-ACETAMINOPHEN-CAFFEINE 250-250-65 MG PO TABS Oral Take 1 tablet by mouth every 6 (six) hours as needed. Migraines    . ESTRADIOL 0.1 MG/24HR TD PTWK Transdermal Place 1 patch onto the skin once a week.    Marland Kitchen FLUCONAZOLE 50 MG PO TABS Oral Take 50 mg by mouth daily.    . ATORVASTATIN CALCIUM 10 MG PO TABS Oral Take 1 tablet (10 mg total) by mouth daily at 6 PM. 30 tablet 0  . HYDROCODONE-ACETAMINOPHEN 5-325 MG PO TABS Oral Take 1-2 tablets by mouth every 4 (four) hours as needed. 15 tablet 0  . PANTOPRAZOLE SODIUM 40 MG PO TBEC Oral Take 1 tablet (40 mg total) by mouth daily at 6 (six) AM. 30 tablet 0    BP 126/79  Pulse 65  Temp  97.5 F (36.4 C) (Oral)  Resp 16  Ht 5\' 5"  (1.651 m)  Wt 178 lb 5.6 oz (80.9 kg)  BMI 29.68 kg/m2  SpO2 99%  Physical Exam  Nursing note and vitals reviewed. Constitutional: She appears well-developed and well-nourished. No distress.  HENT:  Head: Normocephalic and atraumatic.  Eyes: Conjunctivae are normal. Right eye exhibits no discharge. Left eye exhibits no discharge.  Neck: Neck supple.  Cardiovascular: Normal rate, regular rhythm and normal heart sounds.  Exam reveals no gallop and no friction rub.   No murmur heard. Pulmonary/Chest: Effort normal and breath sounds normal. No respiratory distress. She exhibits no tenderness.       Cp not reproducible.  Abdominal: Soft. She exhibits no distension. There is no tenderness.  Musculoskeletal: She exhibits no edema and no tenderness.       Lower extremities symmetric as compared to each other. No calf tenderness. Negative Homan's. No palpable cords.   Neurological: She is alert.  Skin: Skin is warm and dry.  Psychiatric: She has a normal mood and affect. Her behavior is normal. Thought content normal.    ED Course  Procedures (including critical care time)  Labs Reviewed  BASIC METABOLIC PANEL - Abnormal; Notable for the following:    Glucose,  Bld 120 (*)     All other components within normal limits  CBC - Abnormal; Notable for the following:    HCT 35.7 (*)     All other components within normal limits  LIPID PANEL - Abnormal; Notable for the following:    Cholesterol 202 (*)     HDL 38 (*)     LDL Cholesterol 134 (*)     All other components within normal limits  CARDIAC PANEL(CRET KIN+CKTOT+MB+TROPI) - Abnormal; Notable for the following:    Total CK 195 (*)     All other components within normal limits  CBC  PRO B NATRIURETIC PEPTIDE  POCT I-STAT TROPONIN I  D-DIMER, QUANTITATIVE  CREATININE, SERUM  CARDIAC PANEL(CRET KIN+CKTOT+MB+TROPI)  CARDIAC PANEL(CRET KIN+CKTOT+MB+TROPI)  TSH  HEMOGLOBIN A1C    MAGNESIUM  LAB REPORT - SCANNED   No results found.  EKG:  Rhythm: sinus Rate: 68 Axis: borderline left Intervals: normal ST segments: NS ST changes.   1. Chest pain   2. Postmenopausal HRT (hormone replacement therapy)       MDM  46yF with CP. Some risk factors for CAD. Will admit for r/o.        Raeford Razor, MD 11/29/11 [phone]

## 2012-04-11 ENCOUNTER — Other Ambulatory Visit: Payer: Self-pay | Admitting: Obstetrics & Gynecology

## 2012-04-11 DIAGNOSIS — Z1231 Encounter for screening mammogram for malignant neoplasm of breast: Secondary | ICD-10-CM

## 2012-05-19 ENCOUNTER — Ambulatory Visit: Payer: BC Managed Care – PPO

## 2013-05-13 ENCOUNTER — Other Ambulatory Visit: Payer: Self-pay

## 2013-05-13 DIAGNOSIS — Z1231 Encounter for screening mammogram for malignant neoplasm of breast: Secondary | ICD-10-CM

## 2013-05-25 ENCOUNTER — Ambulatory Visit
Admission: RE | Admit: 2013-05-25 | Discharge: 2013-05-25 | Disposition: A | Discharge status: Home / Self Care | Payer: Self-pay | Source: Ambulatory Visit

## 2013-05-25 DIAGNOSIS — Z1231 Encounter for screening mammogram for malignant neoplasm of breast: Secondary | ICD-10-CM

## 2013-06-03 ENCOUNTER — Encounter: Payer: Self-pay | Admitting: Obstetrics & Gynecology

## 2013-06-03 ENCOUNTER — Ambulatory Visit (INDEPENDENT_AMBULATORY_CARE_PROVIDER_SITE_OTHER): Payer: BC Managed Care – PPO | Admitting: Obstetrics & Gynecology

## 2013-06-03 VITALS — BP 106/69 | HR 69 | Temp 97.5°F | Ht 65.0 in | Wt 187.0 lb

## 2013-06-03 DIAGNOSIS — Z01419 Encounter for gynecological examination (general) (routine) without abnormal findings: Secondary | ICD-10-CM

## 2013-06-03 DIAGNOSIS — E28319 Asymptomatic premature menopause: Secondary | ICD-10-CM

## 2013-06-03 MED ORDER — ESTRADIOL 0.05 MG/24HR TD PTTW: 1.0000 | MEDICATED_PATCH | TRANSDERMAL | Status: DC

## 2013-06-03 NOTE — Patient Instructions (Signed)

## 2013-06-03 NOTE — Progress Notes (Signed)
  Subjective:    Katie Crawford is a 48 y.o. female who presents for an annual exam. The patient has no complaints today. The patient is sexually active. GYN screening history: post status hysterectomy in 2012. The patient wears seatbelts: yes. The patient participates in regular exercise: yes. Has the patient ever been transfused or tattooed?: no. The patient reports that there is not domestic violence in her life.   Menstrual History: OB History   Grav Para Term Preterm Abortions TAB SAB Ect Mult Living                  No LMP recorded. Patient has had a hysterectomy.    The following portions of the patient's history were reviewed and updated as appropriate: allergies, current medications, past family history, past medical history, past social history, past surgical history and problem list.  Review of Systems Pertinent items are noted in HPI.    Objective:      General appearance: alert Breasts: normal appearance, no masses or tenderness Abdomen: soft, non-tender; bowel sounds normal; no masses,  no organomegaly Pelvic:  external genitalia normal, no adnexal masses or tenderness and vagina normal without discharge    Assessment:    Healthy female exam.  Menopausal symptoms   Plan:     Restart ERT Return prn or for annual f/u

## 2013-06-11 ENCOUNTER — Ambulatory Visit (HOSPITAL_COMMUNITY)
Admission: RE | Admit: 2013-06-11 | Discharge: 2013-06-11 | Disposition: A | Discharge status: Home / Self Care | Payer: BC Managed Care – PPO | Source: Ambulatory Visit | Attending: Obstetrics & Gynecology | Admitting: Obstetrics & Gynecology

## 2013-06-11 DIAGNOSIS — Z13820 Encounter for screening for osteoporosis: Secondary | ICD-10-CM | POA: Insufficient documentation

## 2013-06-11 DIAGNOSIS — Z780 Asymptomatic menopausal state: Secondary | ICD-10-CM | POA: Insufficient documentation

## 2013-06-11 DIAGNOSIS — E28319 Asymptomatic premature menopause: Secondary | ICD-10-CM

## 2013-06-18 ENCOUNTER — Encounter: Payer: Self-pay | Admitting: Obstetrics & Gynecology

## 2014-04-05 ENCOUNTER — Telehealth: Payer: Self-pay | Admitting: *Deleted

## 2014-04-05 DIAGNOSIS — B3731 Acute candidiasis of vulva and vagina: Secondary | ICD-10-CM

## 2014-04-05 DIAGNOSIS — B373 Candidiasis of vulva and vagina: Secondary | ICD-10-CM

## 2014-04-05 MED ORDER — FLUCONAZOLE 150 MG PO TABS
150.0000 mg | ORAL_TABLET | Freq: Once | ORAL | Status: DC
Start: 2014-04-05 — End: 2014-11-16

## 2014-04-05 NOTE — Telephone Encounter (Signed)
Patient called and requested medication for yeast infection. 5:43 call to patient- patient thinks she has yeast infection and would like Diflucan called to pharmacy. Patient advised if symptoms do not improve- she should call for appointment. Rx sent to pharmacy per office protocol.

## 2014-04-19 ENCOUNTER — Telehealth: Payer: Self-pay | Admitting: *Deleted

## 2014-04-19 NOTE — Telephone Encounter (Signed)
Patient states she is interested in trying the osphena for vaginal dryness. She is not having any luck with lubricants.

## 2014-04-29 ENCOUNTER — Other Ambulatory Visit: Payer: Self-pay

## 2014-04-29 DIAGNOSIS — Z1231 Encounter for screening mammogram for malignant neoplasm of breast: Secondary | ICD-10-CM

## 2014-04-30 ENCOUNTER — Telehealth: Payer: Self-pay | Admitting: *Deleted

## 2014-04-30 NOTE — Telephone Encounter (Signed)
Pt called to office stating that her HRT is to expensive and would like to know what else she could try. Return call to pt.  Pt states that her copay for HRT is to expensive and would like to know if there is anything else that she can try that is cheaper.  Pt was advised to contact her insurance company to see what is covered and to let the office know.  Pt also would like to know if she could possibly make an appt for vaginal dryness that is very concerning.   Pt was scheduled for an appt this month for follow up and pt states that she will discuss HRT at her appt.  Pt was also advised to check with pharmacy to see if there was anything cheaper at a cash price that she could discuss at visit.

## 2014-05-03 NOTE — Telephone Encounter (Signed)
Recommend vaginal estrogen--Estrace cream instead as she is on ERT

## 2014-05-04 NOTE — Telephone Encounter (Signed)
Patient wants to try the vaginal cream- please Rx.

## 2014-05-08 NOTE — Telephone Encounter (Signed)
OK to prescribe Estrace vaginal cream-- 1 gm twice/week.

## 2014-05-10 ENCOUNTER — Ambulatory Visit: Payer: Self-pay | Admitting: Obstetrics & Gynecology

## 2014-05-11 ENCOUNTER — Other Ambulatory Visit: Payer: Self-pay | Admitting: *Deleted

## 2014-05-11 DIAGNOSIS — Z79890 Hormone replacement therapy (postmenopausal): Secondary | ICD-10-CM

## 2014-05-11 MED ORDER — ESTRADIOL 0.1 MG/GM VA CREA: 1.5200 g | TOPICAL_CREAM | VAGINAL | Status: DC

## 2014-05-11 NOTE — Telephone Encounter (Signed)
Rx sent to pharmacy   

## 2014-05-24 ENCOUNTER — Encounter: Payer: Self-pay | Admitting: *Deleted

## 2014-05-25 ENCOUNTER — Encounter: Payer: Self-pay | Admitting: Obstetrics & Gynecology

## 2014-05-26 ENCOUNTER — Ambulatory Visit
Admission: RE | Admit: 2014-05-26 | Discharge: 2014-05-26 | Disposition: A | Discharge status: Home / Self Care | Payer: Managed Care, Other (non HMO) | Source: Ambulatory Visit

## 2014-05-26 DIAGNOSIS — Z1231 Encounter for screening mammogram for malignant neoplasm of breast: Secondary | ICD-10-CM

## 2014-11-11 ENCOUNTER — Ambulatory Visit: Payer: Self-pay | Admitting: Certified Nurse Midwife

## 2014-11-16 ENCOUNTER — Telehealth: Payer: Self-pay

## 2014-11-16 ENCOUNTER — Ambulatory Visit (INDEPENDENT_AMBULATORY_CARE_PROVIDER_SITE_OTHER): Payer: Managed Care, Other (non HMO) | Admitting: Certified Nurse Midwife

## 2014-11-16 ENCOUNTER — Encounter: Payer: Self-pay | Admitting: Certified Nurse Midwife

## 2014-11-16 VITALS — BP 105/68 | HR 66 | Temp 98.3°F | Ht 65.0 in

## 2014-11-16 DIAGNOSIS — B3731 Acute candidiasis of vulva and vagina: Secondary | ICD-10-CM

## 2014-11-16 DIAGNOSIS — Z01419 Encounter for gynecological examination (general) (routine) without abnormal findings: Secondary | ICD-10-CM

## 2014-11-16 DIAGNOSIS — B373 Candidiasis of vulva and vagina: Secondary | ICD-10-CM

## 2014-11-16 DIAGNOSIS — N9489 Other specified conditions associated with female genital organs and menstrual cycle: Secondary | ICD-10-CM

## 2014-11-16 DIAGNOSIS — N898 Other specified noninflammatory disorders of vagina: Secondary | ICD-10-CM

## 2014-11-16 LAB — COMPREHENSIVE METABOLIC PANEL
ALT: 15 U/L (ref 0–35)
AST: 19 U/L (ref 0–37)
Albumin: 4.5 g/dL (ref 3.5–5.2)
Alkaline Phosphatase: 72 U/L (ref 39–117)
BILIRUBIN TOTAL: 0.7 mg/dL (ref 0.2–1.2)
BUN: 21 mg/dL (ref 6–23)
CALCIUM: 9.7 mg/dL (ref 8.4–10.5)
CHLORIDE: 95 meq/L — AB (ref 96–112)
CO2: 30 meq/L (ref 19–32)
CREATININE: 0.87 mg/dL (ref 0.50–1.10)
GLUCOSE: 67 mg/dL — AB (ref 70–99)
POTASSIUM: 3.9 meq/L (ref 3.5–5.3)
SODIUM: 138 meq/L (ref 135–145)
Total Protein: 7.5 g/dL (ref 6.0–8.3)

## 2014-11-16 LAB — CBC WITH DIFFERENTIAL/PLATELET
BASOS PCT: 0 % (ref 0–1)
Basophils Absolute: 0 10*3/uL (ref 0.0–0.1)
EOS ABS: 0.1 10*3/uL (ref 0.0–0.7)
EOS PCT: 1 % (ref 0–5)
HCT: 38.9 % (ref 36.0–46.0)
Hemoglobin: 13.1 g/dL (ref 12.0–15.0)
LYMPHS PCT: 30 % (ref 12–46)
Lymphs Abs: 3.3 10*3/uL (ref 0.7–4.0)
MCH: 28.4 pg (ref 26.0–34.0)
MCHC: 33.7 g/dL (ref 30.0–36.0)
MCV: 84.4 fL (ref 78.0–100.0)
MONO ABS: 0.7 10*3/uL (ref 0.1–1.0)
MPV: 11.9 fL (ref 8.6–12.4)
Monocytes Relative: 6 % (ref 3–12)
Neutro Abs: 6.9 10*3/uL (ref 1.7–7.7)
Neutrophils Relative %: 63 % (ref 43–77)
PLATELETS: 208 10*3/uL (ref 150–400)
RBC: 4.61 MIL/uL (ref 3.87–5.11)
RDW: 13.2 % (ref 11.5–15.5)
WBC: 10.9 10*3/uL — ABNORMAL HIGH (ref 4.0–10.5)

## 2014-11-16 LAB — TRIGLYCERIDES: TRIGLYCERIDES: 136 mg/dL (ref <150)

## 2014-11-16 LAB — TSH: TSH: 1.933 u[IU]/mL (ref 0.350–4.500)

## 2014-11-16 LAB — CHOLESTEROL, TOTAL: Cholesterol: 174 mg/dL (ref 0–200)

## 2014-11-16 LAB — HDL CHOLESTEROL: HDL: 42 mg/dL — ABNORMAL LOW (ref >=46)

## 2014-11-16 MED ORDER — FLUCONAZOLE 100 MG PO TABS: 100.0000 mg | ORAL_TABLET | Freq: Once | ORAL | Status: DC

## 2014-11-16 MED ORDER — OSPEMIFENE 60 MG PO TABS: 1.0000 | ORAL_TABLET | Freq: Every day | ORAL | Status: DC

## 2014-11-16 MED ORDER — ESTRADIOL 0.1 MG/GM VA CREA
1.0000 | TOPICAL_CREAM | VAGINAL | Status: DC
Start: 2014-11-16 — End: 2015-11-18

## 2014-11-16 MED ORDER — ESTRADIOL 0.05 MG/24HR TD PTTW: 1.0000 | MEDICATED_PATCH | TRANSDERMAL | Status: DC

## 2014-11-16 NOTE — Telephone Encounter (Signed)
FAXED PATIENT INFO TO [facility] TODAY - THEY WILL [facility] DIRECTLY WITH PATIENT

## 2014-11-16 NOTE — Progress Notes (Signed)
Patient ID: Katie Crawford, female   DOB: 1965/12/18, 49 y.o.   MRN: [id number]    Subjective:     Katie Crawford is a 49 y.o. female here for a routine exam.  Current complaints: hot flashes, knows what the triggers are.  Has had anxiety attack in 2013.  Using the patch.  Vaginal dryness.  Currently Sexually active, but having issues d/t vaginal dryness.   Had been on cholesterol lowering medication before and lost a lot of weight.  Has had a hx of low vitamin D.     Personal health questionnaire:  Is patient Ashkenazi Jewish, have a family history of breast and/or ovarian cancer: no Is there a family history of uterine cancer diagnosed at age < 10, gastrointestinal cancer, urinary tract cancer, family member who is a Field seismologist syndrome-associated carrier: no Is the patient overweight and hypertensive, family history of diabetes, personal history of gestational diabetes, preeclampsia or PCOS: no Is patient over 24, have PCOS,  family history of premature CHD under age 44, diabetes, smoke, have hypertension or peripheral artery disease:  no At any time, has a partner hit, kicked or otherwise hurt or frightened you?: no Over the past 2 weeks, have you felt down, depressed or hopeless?: no Over the past 2 weeks, have you felt little interest or pleasure in doing things?:no   Gynecologic History No LMP recorded. Patient has had a hysterectomy. in 2012.   Contraception: status post hysterectomy Last Pap: unknown. Results were: normal according to the patient Last mammogram: May 26, 2014. Results were: normal  Obstetric History OB History  No data available    Past Medical History  Diagnosis Date  . CHF (congestive heart failure)   . High cholesterol   . Cardiomyopathy     Past Surgical History  Procedure Laterality Date  . Abdominal hysterectomy       Current outpatient prescriptions:  .  estradiol (VIVELLE-DOT) 0.05 MG/24HR patch, Place 1 patch (0.05 mg total) onto the skin once a  week., Disp: 8 patch, Rfl: 12 .  estradiol (ESTRACE) 0.1 MG/GM vaginal cream, Place 1 Applicatorful vaginally 3 (three) times a week., Disp: 42.5 g, Rfl: 12 .  fluconazole (DIFLUCAN) 100 MG tablet, Take 1 tablet (100 mg total) by mouth once. Repeat dose in 48-72 hour., Disp: 3 tablet, Rfl: 0 .  Ospemifene (OSPHENA) 60 MG TABS, Take 1 tablet by mouth daily., Disp: 30 tablet, Rfl: 12 No Known Allergies  History  Substance Use Topics  . Smoking status: Never Smoker   . Smokeless tobacco: Not on file  . Alcohol Use: 0.0 oz/week    0 Standard drinks or equivalent per week     Comment: occasionally    History reviewed. No pertinent family history.    Review of Systems  Constitutional: negative for fatigue and weight loss Respiratory: negative for cough and wheezing Cardiovascular: negative for chest pain, fatigue and palpitations Gastrointestinal: negative for abdominal pain and change in bowel habits Musculoskeletal:negative for myalgias Neurological: negative for gait problems and tremors Behavioral/Psych: negative for abusive relationship, depression Endocrine: negative for temperature intolerance   Genitourinary:negative for abnormal menstrual periods, genital lesions, hot flashes, sexual problems and vaginal discharge Integument/breast: negative for breast lump, breast tenderness, nipple discharge and skin lesion(s)    Objective:       BP 105/68 mmHg  Pulse 66  Temp(Src) 98.3 F (36.8 C)  Ht 5\' 5"  (1.651 m) General:   alert  Skin:   no rash or abnormalities  Lungs:  clear to auscultation bilaterally  Heart:   regular rate and rhythm, S1, S2 normal, no murmur, click, rub or gallop  Breasts:   normal without suspicious masses, skin or nipple changes or axillary nodes  Abdomen:  normal findings: no organomegaly, soft, non-tender and no hernia  Pelvis:  External genitalia: normal general appearance Urinary system: urethral meatus normal and bladder without fullness,  nontender Vaginal: normal without tenderness, induration or masses Cervix: normal appearance Adnexa: normal bimanual exam Uterus: anteverted and non-tender, normal size   Lab Review Urine pregnancy test Labs reviewed yes Radiologic studies reviewed yes  50% of 30 min visit spent on counseling and coordination of care.   Assessment:    Healthy female exam.   Dyspareunia Hormone replacement therapy.     Plan:    Education reviewed: calcium supplements, depression evaluation, low fat, low cholesterol diet, safe sex/STD prevention, self breast exams, skin cancer screening and weight bearing exercise. Contraception: post menopausal status. Hormone replacement therapy: hormone replacement therapy: osphena, estrace given, estrogen patches. Follow up in: 1 year. Patient given phamplet/coupon for Osphena  Mammogram due January 2017 Meds ordered this encounter  Medications  . estradiol (ESTRACE) 0.1 MG/GM vaginal cream    Sig: Place 1 Applicatorful vaginally 3 (three) times a week.    Dispense:  42.5 g    Refill:  12  . Ospemifene (OSPHENA) 60 MG TABS    Sig: Take 1 tablet by mouth daily.    Dispense:  30 tablet    Refill:  12  . estradiol (VIVELLE-DOT) 0.05 MG/24HR patch    Sig: Place 1 patch (0.05 mg total) onto the skin once a week.    Dispense:  8 patch    Refill:  12  . fluconazole (DIFLUCAN) 100 MG tablet    Sig: Take 1 tablet (100 mg total) by mouth once. Repeat dose in 48-72 hour.    Dispense:  3 tablet    Refill:  0   Orders Placed This Encounter  Procedures  . SureSwab Bacterial Vaginosis/itis  . Cholesterol, total  . Triglycerides  . HDL cholesterol  . TSH  . Comprehensive metabolic panel  . CBC with Differential/Platelet  . Vitamin D (25 hydroxy)   [facility] Referral done.

## 2014-11-17 LAB — VITAMIN D 25 HYDROXY (VIT D DEFICIENCY, FRACTURES): VIT D 25 HYDROXY: 21 ng/mL — AB (ref 30–100)

## 2014-11-18 LAB — PAP IG AND HPV HIGH-RISK: HPV DNA HIGH RISK: NOT DETECTED

## 2014-11-19 LAB — SURESWAB BACTERIAL VAGINOSIS/ITIS
ATOPOBIUM VAGINAE: NOT DETECTED Log (cells/mL)
C. albicans, DNA: NOT DETECTED
C. glabrata, DNA: NOT DETECTED
C. parapsilosis, DNA: NOT DETECTED
C. tropicalis, DNA: NOT DETECTED
GARDNERELLA VAGINALIS: 5.4 Log (cells/mL)
LACTOBACILLUS SPECIES: NOT DETECTED Log (cells/mL)
MEGASPHAERA SPECIES: NOT DETECTED Log (cells/mL)
T. vaginalis RNA, QL TMA: NOT DETECTED

## 2015-11-18 ENCOUNTER — Encounter: Payer: Self-pay | Admitting: Certified Nurse Midwife

## 2015-11-18 ENCOUNTER — Ambulatory Visit (INDEPENDENT_AMBULATORY_CARE_PROVIDER_SITE_OTHER): Payer: Managed Care, Other (non HMO) | Admitting: Certified Nurse Midwife

## 2015-11-18 VITALS — BP 110/68 | HR 67 | Temp 98.5°F

## 2015-11-18 DIAGNOSIS — Z01419 Encounter for gynecological examination (general) (routine) without abnormal findings: Secondary | ICD-10-CM

## 2015-11-18 DIAGNOSIS — Z1389 Encounter for screening for other disorder: Secondary | ICD-10-CM

## 2015-11-18 LAB — POCT URINALYSIS DIPSTICK
Bilirubin, UA: NEGATIVE
Glucose, UA: NEGATIVE
KETONES UA: NEGATIVE
Leukocytes, UA: NEGATIVE
Nitrite, UA: NEGATIVE
PH UA: 8
PROTEIN UA: NEGATIVE
RBC UA: NEGATIVE
UROBILINOGEN UA: NEGATIVE

## 2015-11-18 NOTE — Progress Notes (Signed)
Patient ID: Katie Crawford, female   DOB: 04-Oct-1965, 50 y.o.   MRN: [id number]    Subjective:        Katie Crawford is a 50 y.o. female here for a routine exam.  Current complaints: hot flashes, knows what the triggers are. Has had anxiety attack in 2013. Using the patch. Vaginal dryness. Currently Sexually active, but having issues d/t vaginal dryness. Had been on cholesterol lowering medication before and lost a lot of weight. Has had a hx of low vitamin D.Declines STD screening.    Personal health questionnaire:  Is patient Ashkenazi Jewish, have a family history of breast and/or ovarian cancer: no Is there a family history of uterine cancer diagnosed at age < 52, gastrointestinal cancer, urinary tract cancer, family member who is a Field seismologist syndrome-associated carrier: no Is the patient overweight and hypertensive, family history of diabetes, personal history of gestational diabetes, preeclampsia or PCOS: no Is patient over 72, have PCOS, family history of premature CHD under age 37, diabetes, smoke, have hypertension or peripheral artery disease: no At any time, has a partner hit, kicked or otherwise hurt or frightened you?: no Over the past 2 weeks, have you felt down, depressed or hopeless?: no Over the past 2 weeks, have you felt little interest or pleasure in doing things?:no  Gynecologic History No LMP recorded. Patient has had a hysterectomy. Contraception: status post hysterectomy in 2012 Last Pap: 11/16/14. Results were: normal Last mammogram: 05/26/14. Results were: normal  Obstetric History OB History  No data available    Past Medical History  Diagnosis Date  . CHF (congestive heart failure) ([place])   . High cholesterol   . Cardiomyopathy [facility])     Past Surgical History  Procedure Laterality Date  . Abdominal hysterectomy       Current outpatient prescriptions:  .  estradiol (VIVELLE-DOT) 0.05 MG/24HR patch, Place 1 patch (0.05 mg total) onto the skin once a  week., Disp: 8 patch, Rfl: 12 No Known Allergies  Social History  Substance Use Topics  . Smoking status: Never Smoker   . Smokeless tobacco: Not on file  . Alcohol Use: 0.0 oz/week    0 Standard drinks or equivalent per week     Comment: occasionally    No family history on file.    Review of Systems  Constitutional: negative for fatigue and weight loss Respiratory: negative for cough and wheezing Cardiovascular: negative for chest pain, fatigue and palpitations Gastrointestinal: negative for abdominal pain and change in bowel habits Musculoskeletal:negative for myalgias Neurological: negative for gait problems and tremors Behavioral/Psych: negative for abusive relationship, depression Endocrine: negative for temperature intolerance   Genitourinary:negative for abnormal menstrual periods, genital lesions, hot flashes, sexual problems and vaginal discharge Integument/breast: negative for breast lump, breast tenderness, nipple discharge and skin lesion(s)    Objective:       BP 110/68 mmHg  Pulse 67  Temp(Src) 98.5 F (36.9 C) General:   alert  Skin:   no rash or abnormalities  Lungs:   clear to auscultation bilaterally  Heart:   regular rate and rhythm, S1, S2 normal, no murmur, click, rub or gallop  Breasts:   normal without suspicious masses, skin or nipple changes or axillary nodes  Abdomen:  normal findings: no organomegaly, soft, non-tender and no hernia  Pelvis:  External genitalia: normal general appearance Urinary system: urethral meatus normal and bladder without fullness, nontender Vaginal: normal without tenderness, induration or masses Cervix: surgically absent Adnexa: normal bimanual exam Uterus: surgically absent  Lab Review Urine pregnancy test Labs reviewed yes Radiologic studies reviewed yes  50% of 30 min visit spent on counseling and coordination of care.   Assessment:    Healthy female exam.   HRT: post menopausal status   Plan:     Education reviewed: calcium supplements, depression evaluation, low fat, low cholesterol diet, safe sex/STD prevention, self breast exams, skin cancer screening and weight bearing exercise. Contraception: post menopausal status. Hormone replacement therapy: hormone replacement therapy: patches. Mammogram ordered. Follow up in: 1 year.   No orders of the defined types were placed in this encounter.   Orders Placed This Encounter  Procedures  . POCT urinalysis dipstick   Need to obtain previous records Possible management options include: vaginal ring, brisdelle, osphena, wellbutrin, gabapentin

## 2015-11-23 LAB — NUSWAB VG, CANDIDA 6SP
CANDIDA KRUSEI, NAA: NEGATIVE
Candida albicans, NAA: NEGATIVE
Candida glabrata, NAA: NEGATIVE
Candida lusitaniae, NAA: NEGATIVE
Candida parapsilosis, NAA: NEGATIVE
Candida tropicalis, NAA: NEGATIVE
Trich vag by NAA: NEGATIVE

## 2015-12-06 ENCOUNTER — Other Ambulatory Visit: Payer: Self-pay | Admitting: Certified Nurse Midwife

## 2015-12-06 DIAGNOSIS — Z1231 Encounter for screening mammogram for malignant neoplasm of breast: Secondary | ICD-10-CM

## 2016-11-20 ENCOUNTER — Ambulatory Visit: Payer: Self-pay | Admitting: Certified Nurse Midwife

## 2017-02-11 ENCOUNTER — Encounter: Payer: Self-pay | Admitting: *Deleted

## 2017-02-11 ENCOUNTER — Other Ambulatory Visit: Payer: Self-pay | Admitting: Certified Nurse Midwife

## 2017-02-11 NOTE — Progress Notes (Signed)
Reminder letter mailed to patient

## 2018-07-03 ENCOUNTER — Other Ambulatory Visit: Payer: Self-pay

## 2019-08-13 ENCOUNTER — Ambulatory Visit: Payer: Managed Care, Other (non HMO) | Attending: Family

## 2019-08-13 DIAGNOSIS — Z23 Encounter for immunization: Secondary | ICD-10-CM

## 2019-08-13 NOTE — Progress Notes (Signed)
   Covid-19 Vaccination Clinic  Name:  Awanda Molitoris    MRN: [id number] DOB: [birth year]  08/13/2019  Ms. Amoah was observed post Covid-19 immunization for 15 minutes without incident. She was provided with Vaccine Information Sheet and instruction to access the V-Safe system.   Ms. Salas was instructed to call 911 with any severe reactions post vaccine: Marland Kitchen Difficulty breathing  . Swelling of face and throat  . A fast heartbeat  . A bad rash all over body  . Dizziness and weakness   Immunizations Administered    Name Date Dose VIS Date Route   Moderna COVID-19 Vaccine 08/13/2019  2:10 PM 0.5 mL 04/28/2019 Intramuscular   Manufacturer: Moderna   Lot: [id number]   [id number]

## 2019-09-08 ENCOUNTER — Other Ambulatory Visit: Payer: Self-pay | Admitting: *Deleted

## 2019-09-08 ENCOUNTER — Other Ambulatory Visit: Payer: Self-pay | Admitting: Physician Assistant

## 2019-09-08 DIAGNOSIS — Z1231 Encounter for screening mammogram for malignant neoplasm of breast: Secondary | ICD-10-CM

## 2019-09-10 ENCOUNTER — Ambulatory Visit: Payer: Managed Care, Other (non HMO) | Attending: Family

## 2019-09-10 DIAGNOSIS — Z23 Encounter for immunization: Secondary | ICD-10-CM

## 2019-09-10 NOTE — Progress Notes (Signed)
   Covid-19 Vaccination Clinic  Name:  Katie Crawford    MRN: [id number] DOB: 1966-03-01  09/10/2019  Katie Crawford was observed post Covid-19 immunization for 15 minutes without incident. She was provided with Vaccine Information Sheet and instruction to access the V-Safe system.   Katie Crawford was instructed to call 911 with any severe reactions post vaccine: Marland Kitchen Difficulty breathing  . Swelling of face and throat  . A fast heartbeat  . A bad rash all over body  . Dizziness and weakness   Immunizations Administered    Name Date Dose VIS Date Route   Moderna COVID-19 Vaccine 09/10/2019  4:29 PM 0.5 mL 04/28/2019 Intramuscular   Manufacturer: Moderna   Lot: [id number]   [place]:[id number]

## 2019-09-14 ENCOUNTER — Other Ambulatory Visit: Payer: Self-pay

## 2019-09-14 ENCOUNTER — Ambulatory Visit (INDEPENDENT_AMBULATORY_CARE_PROVIDER_SITE_OTHER): Payer: Managed Care, Other (non HMO) | Admitting: Advanced Practice Midwife

## 2019-09-14 ENCOUNTER — Encounter: Payer: Self-pay | Admitting: Advanced Practice Midwife

## 2019-09-14 VITALS — BP 135/78 | HR 62 | Ht 65.0 in | Wt 171.0 lb

## 2019-09-14 DIAGNOSIS — Z01419 Encounter for gynecological examination (general) (routine) without abnormal findings: Secondary | ICD-10-CM

## 2019-09-14 DIAGNOSIS — N644 Mastodynia: Secondary | ICD-10-CM

## 2019-09-14 DIAGNOSIS — Z90710 Acquired absence of both cervix and uterus: Secondary | ICD-10-CM

## 2019-09-14 NOTE — Patient Instructions (Signed)
Mammogram A mammogram is a low energy X-ray of the breasts that is done to check for abnormal changes. This procedure can screen for and detect any changes that may indicate breast cancer. Mammograms are regularly done on women. A man may have a mammogram if he has a lump or swelling in his breast. A mammogram can also identify other changes and variations in the breast, such as:  Inflammation of the breast tissue (mastitis).  An infected area that contains a collection of pus (abscess).  A fluid-filled sac (cyst).  Fibrocystic changes. This is when breast tissue becomes denser, which can make the tissue feel rope-like or uneven under the skin.  Tumors that are not cancerous (benign). Tell a health care provider:  About any allergies you have.  If you have breast implants.  If you have had previous breast disease, biopsy, or surgery.  If you are breastfeeding.  If you are younger than age 80.  If you have a family history of breast cancer.  Whether you are pregnant or may be pregnant. What are the risks? Generally, this is a safe procedure. However, problems may occur, including:  Exposure to radiation. Radiation levels are very low with this test.  The results being misinterpreted.  The need for further tests.  The inability of the mammogram to detect certain cancers. What happens before the procedure?  Schedule your test about 1-2 weeks after your menstrual period if you are still menstruating. This is usually when your breasts are the least tender.  If you have had a mammogram done at a different facility in the past, get the mammogram X-rays or have them sent to your current exam facility. The new and old images will be compared.  Wash your breasts and underarms on the day of the test.  Do not wear deodorants, perfumes, lotions, or powders anywhere on your body on the day of the test.  Remove any jewelry from your neck.  Wear clothes that you can change into and  out of easily. What happens during the procedure?   You will undress from the waist up and put on a gown that opens in the front.  You will stand in front of the X-ray machine.  Each breast will be placed between two plastic or glass plates. The plates will compress your breast for a few seconds. Try to stay as relaxed as possible during the procedure. This does not cause any harm to your breasts and any discomfort you feel will be very brief.  X-rays will be taken from different angles of each breast. The procedure may vary among health care providers and hospitals. What happens after the procedure?  The mammogram will be examined by a specialist (radiologist).  You may need to repeat certain parts of the test, depending on the quality of the images. This is commonly done if the radiologist needs a better view of the breast tissue.  You may resume your normal activities.  It is up to you to get the results of your procedure. Ask your health care provider, or the department that is doing the procedure, when your results will be ready. Summary  A mammogram is a low energy X-ray of the breasts that is done to check for abnormal changes. A man may have a mammogram if he has a lump or swelling in his breast.  If you have had a mammogram done at a different facility in the past, get the mammogram X-rays or have them sent to your  current exam facility in order to compare them.  Schedule your test about 1-2 weeks after your menstrual period if you are still menstruating.  For this test, each breast will be placed between two plastic or glass plates. The plates will compress your breast for a few seconds.  Ask when your test results will be ready. Make sure you get your test results. This information is not intended to replace advice given to you by your health care provider. Make sure you discuss any questions you have with your health care provider.  Preventive Care 77-47 Years Old,  Female Preventive care refers to visits with your health care provider and lifestyle choices that can promote health and wellness. This includes:  A yearly physical exam. This may also be called an annual well check.  Regular dental visits and eye exams.  Immunizations.  Screening for certain conditions.  Healthy lifestyle choices, such as eating a healthy diet, getting regular exercise, not using drugs or products that contain nicotine and tobacco, and limiting alcohol use. What can I expect for my preventive care visit? Physical exam Your health care provider will check your:  Height and weight. This may be used to calculate body mass index (BMI), which tells if you are at a healthy weight.  Heart rate and blood pressure.  Skin for abnormal spots. Counseling Your health care provider may ask you questions about your:  Alcohol, tobacco, and drug use.  Emotional well-being.  Home and relationship well-being.  Sexual activity.  Eating habits.  Work and work Statistician.  Method of birth control.  Menstrual cycle.  Pregnancy history. What immunizations do I need?  Influenza (flu) vaccine  This is recommended every year. Tetanus, diphtheria, and pertussis (Tdap) vaccine  You may need a Td booster every 10 years. Varicella (chickenpox) vaccine  You may need this if you have not been vaccinated. Zoster (shingles) vaccine  You may need this after age 74. Measles, mumps, and rubella (MMR) vaccine  You may need at least one dose of MMR if you were born in 1957 or later. You may also need a second dose. Pneumococcal conjugate (PCV13) vaccine  You may need this if you have certain conditions and were not previously vaccinated. Pneumococcal polysaccharide (PPSV23) vaccine  You may need one or two doses if you smoke cigarettes or if you have certain conditions. Meningococcal conjugate (MenACWY) vaccine  You may need this if you have certain conditions. Hepatitis  A vaccine  You may need this if you have certain conditions or if you travel or work in places where you may be exposed to hepatitis A. Hepatitis B vaccine  You may need this if you have certain conditions or if you travel or work in places where you may be exposed to hepatitis B. Haemophilus influenzae type b (Hib) vaccine  You may need this if you have certain conditions. Human papillomavirus (HPV) vaccine  If recommended by your health care provider, you may need three doses over 6 months. You may receive vaccines as individual doses or as more than one vaccine together in one shot (combination vaccines). Talk with your health care provider about the risks and benefits of combination vaccines. What tests do I need? Blood tests  Lipid and cholesterol levels. These may be checked every 5 years, or more frequently if you are over 38 years old.  Hepatitis C test.  Hepatitis B test. Screening  Lung cancer screening. You may have this screening every year starting at age 44  if you have a 30-pack-year history of smoking and currently smoke or have quit within the past 15 years.  Colorectal cancer screening. All adults should have this screening starting at age 69 and continuing until age 76. Your health care provider may recommend screening at age 75 if you are at increased risk. You will have tests every 1-10 years, depending on your results and the type of screening test.  Diabetes screening. This is done by checking your blood sugar (glucose) after you have not eaten for a while (fasting). You may have this done every 1-3 years.  Mammogram. This may be done every 1-2 years. Talk with your health care provider about when you should start having regular mammograms. This may depend on whether you have a family history of breast cancer.  BRCA-related cancer screening. This may be done if you have a family history of breast, ovarian, tubal, or peritoneal cancers.  Pelvic exam and Pap test.  This may be done every 3 years starting at age 64. Starting at age 55, this may be done every 5 years if you have a Pap test in combination with an HPV test. Other tests  Sexually transmitted disease (STD) testing.  Bone density scan. This is done to screen for osteoporosis. You may have this scan if you are at high risk for osteoporosis. Follow these instructions at home: Eating and drinking  Eat a diet that includes fresh fruits and vegetables, whole grains, lean protein, and low-fat dairy.  Take vitamin and mineral supplements as recommended by your health care provider.  Do not drink alcohol if: ? Your health care provider tells you not to drink. ? You are pregnant, may be pregnant, or are planning to become pregnant.  If you drink alcohol: ? Limit how much you have to 0-1 drink a day. ? Be aware of how much alcohol is in your drink. In the [place] one drink equals one 12 oz bottle of beer (355 mL), one 5 oz glass of wine (148 mL), or one 1 oz glass of hard liquor (44 mL). Lifestyle  Take daily care of your teeth and gums.  Stay active. Exercise for at least 30 minutes on 5 or more days each week.  Do not use any products that contain nicotine or tobacco, such as cigarettes, e-cigarettes, and chewing tobacco. If you need help quitting, ask your health care provider.  If you are sexually active, practice safe sex. Use a condom or other form of birth control (contraception) in order to prevent pregnancy and STIs (sexually transmitted infections).  If told by your health care provider, take low-dose aspirin daily starting at age 60. What's next?  Visit your health care provider once a year for a well check visit.  Ask your health care provider how often you should have your eyes and teeth checked.  Stay up to date on all vaccines. This information is not intended to replace advice given to you by your health care provider. Make sure you discuss any questions you have with your  health care provider. Document Revised: 01/23/2018 Document Reviewed: 01/23/2018 Elsevier Patient Education  [place] Revised: 01/02/2018 Document Reviewed: 01/02/2018 Elsevier Patient Education  2020 [employer].

## 2019-09-14 NOTE — Progress Notes (Signed)
GYN presents for right Breast pain 8/10 x 3 months.  Denies lump, LOF.   Last PAP 11/16/2014 had Hysterectomy 08/2010

## 2019-09-14 NOTE — Progress Notes (Signed)
  GYNECOLOGY PROGRESS NOTE  History:  54 y.o. presents to [place] office today for problem gyn visit. She reports pain in her right breast x 2-3 months, in axillary area, radiates into right nipple. Pt reports COVID infection in January and breast pain since that time.  She does not feel any lumps or bumps. Pain is tingling sensation. She has tried heat, ice, Tylenol, Motrin but it is only temporarily improved.  She denies h/a, dizziness, shortness of breath, n/v, or fever/chills.    The following portions of the patient's history were reviewed and updated as appropriate: allergies, current medications, past family history, past medical history, past social history, past surgical history and problem list. Last pap smear in 2016 was normal, total abdominal hysterectomy in 2012.  Review of Systems:  Pertinent items are noted in HPI.   Objective:  Physical Exam Blood pressure 135/78, pulse 62, height 5\' 5"  (1.651 m), weight 171 lb (77.6 kg). VS reviewed, nursing note reviewed,  Constitutional: well developed, well nourished, no distress HEENT: normocephalic CV: normal rate Pulm/chest wall: normal effort Breast Exam: deferred Abdomen: soft Neuro: alert and oriented x 3 Skin: warm, dry Psych: affect normal Pelvic exam: Cervix pink, visually closed, without lesion, scant white creamy discharge, vaginal walls and external genitalia normal Bimanual exam: Cervix 0/long/high, firm, anterior, neg CMT, uterus nontender, nonenlarged, adnexa without tenderness, enlargement, or mass  Assessment & Plan:   1. Breast pain, right --Breast exam benign, some fibrocystic changes bilaterally, no lymphadenopathy on exam - MM Digital Diagnostic Bilat; Future  2. Well woman exam with routine gynecological exam --Total benign hysterectomy for painful fibroids in 2012 so no need for Pap --Bimanual exam today with shared decision making.  Exam wnl.   Fatima Blank, CNM 9:34 AM

## 2019-09-15 ENCOUNTER — Ambulatory Visit: Payer: Managed Care, Other (non HMO)

## 2019-09-17 ENCOUNTER — Other Ambulatory Visit: Payer: Self-pay

## 2019-09-17 ENCOUNTER — Ambulatory Visit: Payer: Self-pay | Admitting: Medical

## 2019-09-17 ENCOUNTER — Encounter: Payer: Self-pay | Admitting: Medical

## 2019-09-17 VITALS — BP 124/70 | Temp 97.3°F | Wt 168.0 lb

## 2019-09-17 DIAGNOSIS — Z1239 Encounter for other screening for malignant neoplasm of breast: Secondary | ICD-10-CM

## 2019-09-17 NOTE — Patient Instructions (Signed)
Mammogram °A mammogram is an X-ray of the breasts that is done to check for changes that are not normal. This test can screen for and find any changes that may suggest breast cancer. Mammograms are regularly done on women. A man may have a mammogram if he has a lump or swelling in his breast. This test can also help to find other changes and variations in the breast. °Tell a doctor: °· About any allergies you have. °· If you have breast implants. °· If you have had previous breast disease, biopsy, or surgery. °· If you are breastfeeding. °· If you are younger than age 25. °· If you have a family history of breast cancer. °· Whether you are pregnant or may be pregnant. °What are the risks? °Generally, this is a safe procedure. However, problems may occur, including: °· Exposure to radiation. Radiation levels are very low with this test. °· The results being misinterpreted. °· The need for further tests. °· The inability of the mammogram to detect certain cancers. °What happens before the procedure? °· Have this test done about 1-2 weeks after your period. This is usually when your breasts are the least tender. °· If you are visiting a new doctor or clinic, send any past mammogram images to your new doctor's office. °· Wash your breasts and under your arms the day of the test. °· Do not use deodorants, perfumes, lotions, or powders on the day of the test. °· Take off any jewelry from your neck. °· Wear clothes that you can change into and out of easily. °What happens during the procedure? ° °· You will undress from the waist up. You will put on a gown. °· You will stand in front of the X-ray machine. °· Each breast will be placed between two plastic or glass plates. The plates will press down on your breast for a few seconds. Try to stay as relaxed as possible. This does not cause any harm to your breasts. Any discomfort you feel will be very brief. °· X-rays will be taken from different angles of each breast. °The  procedure may vary among doctors and hospitals. °What happens after the procedure? °· The mammogram will be read by a specialist (radiologist). °· You may need to do certain parts of the test again. This depends on the quality of the images. °· Ask when your test results will be ready. Make sure you get your test results. °· You may go back to your normal activities. °Summary °· A mammogram is a low energy X-ray of the breasts that is done to check for abnormal changes. A man may have this test if he has a lump or swelling in his breast. °· Before the procedure, tell your doctor about any breast problems that you have had in the past. °· Have this test done about 1-2 weeks after your period. °· For the test, each breast will be placed between two plastic or glass plates. The plates will press down on your breast for a few seconds. °· The mammogram will be read by a specialist (radiologist). Ask when your test results will be ready. Make sure you get your test results. °This information is not intended to replace advice given to you by your health care provider. Make sure you discuss any questions you have with your health care provider. °Document Revised: 01/02/2018 Document Reviewed: 01/02/2018 °Elsevier Patient Education © 2020 Elsevier Inc. ° °

## 2019-09-17 NOTE — Progress Notes (Signed)
Ms. Katie Crawford is a 54 y.o. female who presents to [facility] clinic today with complaint of right breast pain x 2 months. Pain is in the outer and axillary area. Pain is constant rated at 8/10. The patient is not relieved with Motrin. She denies breast mass or nipple discharge. She has tried avoiding caffeine and bras with under wire.    Pap Smear: Pap not smear completed today. Last Pap smear was 6/21/206 was normal. Per patient has no history of an abnormal Pap smear. Last Pap smear result is not available in Epic.   Physical exam: Breasts Breasts symmetrical. No skin abnormalities bilateral breasts. No nipple retraction bilateral breasts. No nipple discharge bilateral breasts. No lymphadenopathy. No lumps palpated bilateral breasts. Mild diffuse fibrocystic changes noted bilaterally. Tenderness to palpation at 9:00 on the right without masses noted.    Pelvic/Bimanual Pap is not indicated today. Patient had hysterectomy in 2012 due to uterine fibroids   Smoking History: Patient has never smoked.     Patient Navigation: Patient education provided. Access to services provided for patient through [facility] program.    Colorectal Cancer Screening: Per patient has never had colonoscopy completed No complaints today.    Breast and Cervical Cancer Risk Assessment: Patient does not have family history of breast cancer, known genetic mutations, or radiation treatment to the chest before age 24. Patient does not have history of cervical dysplasia, immunocompromised, or DES exposure in-utero.  Risk Assessment    Risk Scores      09/17/2019   Last edited by: Demetrius Revel, LPN   5-year risk: 1.2 %   Lifetime risk: 8.3 %          A: [facility] exam without pap smear Complaint of right breast pain  P: Referred patient to the [facility] for a diagnostic mammogram. Appointment scheduled 09/22/19 @ 320pm.  Luvenia Redden, PA-C 09/17/2019 10:40 AM

## 2019-09-22 ENCOUNTER — Ambulatory Visit
Admission: RE | Admit: 2019-09-22 | Discharge: 2019-09-22 | Disposition: A | Discharge status: Home / Self Care | Payer: No Typology Code available for payment source | Source: Ambulatory Visit | Attending: Obstetrics and Gynecology | Admitting: Obstetrics and Gynecology

## 2019-09-22 ENCOUNTER — Ambulatory Visit
Admission: RE | Admit: 2019-09-22 | Discharge: 2019-09-22 | Disposition: A | Discharge status: Home / Self Care | Payer: Managed Care, Other (non HMO) | Source: Ambulatory Visit | Attending: Obstetrics and Gynecology | Admitting: Obstetrics and Gynecology

## 2019-09-22 ENCOUNTER — Other Ambulatory Visit: Payer: Self-pay

## 2019-09-22 DIAGNOSIS — N644 Mastodynia: Secondary | ICD-10-CM

## 2021-06-21 IMAGING — US US BREAST*R* LIMITED INC AXILLA
1 series · 3 of 3 positions shown · non-contrast
Comparison: Previous exam(s).

CLINICAL DATA: 54-year-old presenting with focal pain involving the
UPPER OUTER QUADRANT of the RIGHT breast over the past 4 months.
Annual evaluation, LEFT breast.

EXAM:
DIGITAL DIAGNOSTIC BILATERAL MAMMOGRAM WITH CAD AND TOMO
ULTRASOUND RIGHT BREAST

[Series 1: us breast*right* limited inc axilla · 0.06mm/px · 3 of 3 slices shown]
[im 1/3]
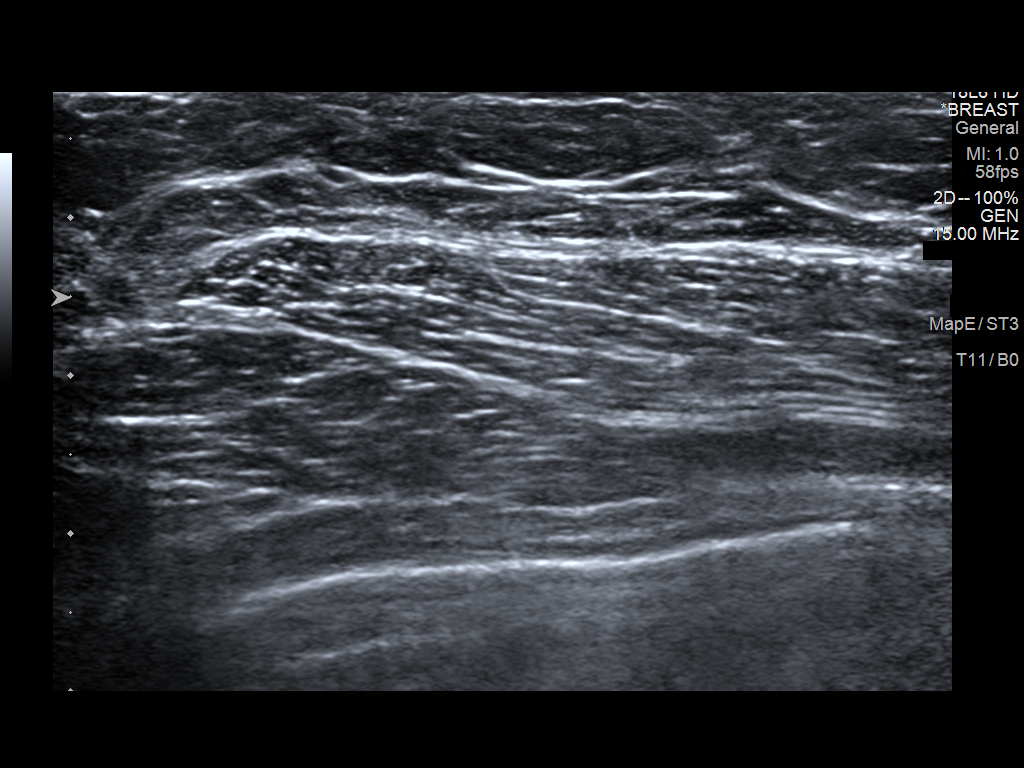
[im 2/3]
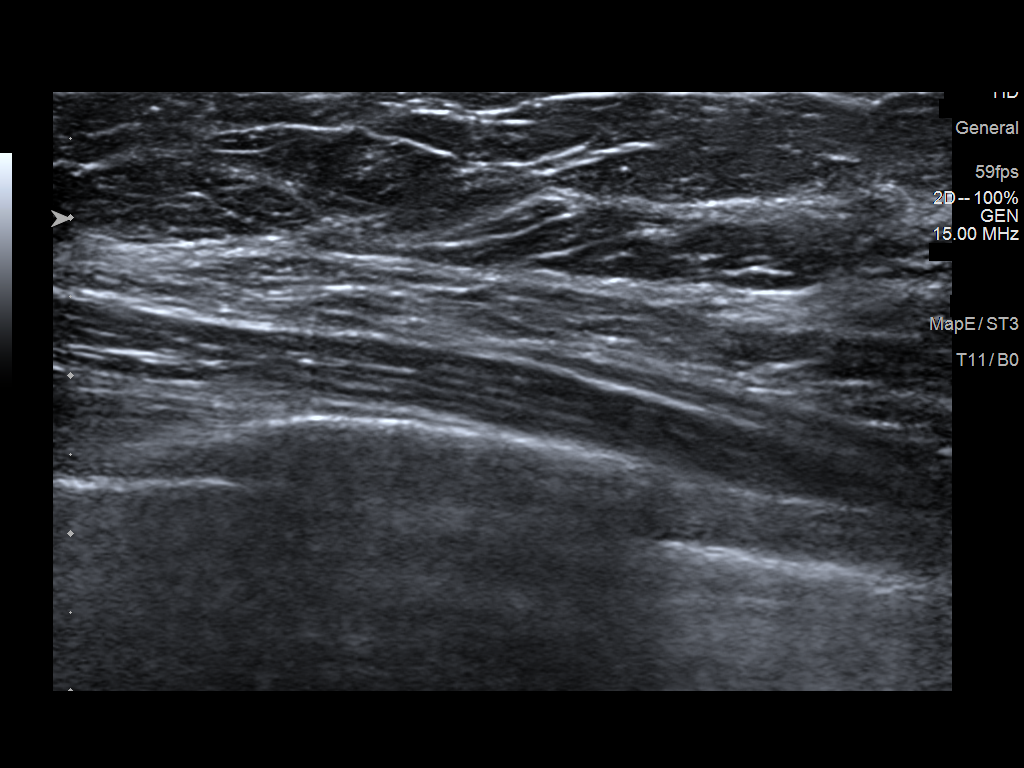
[im 3/3]
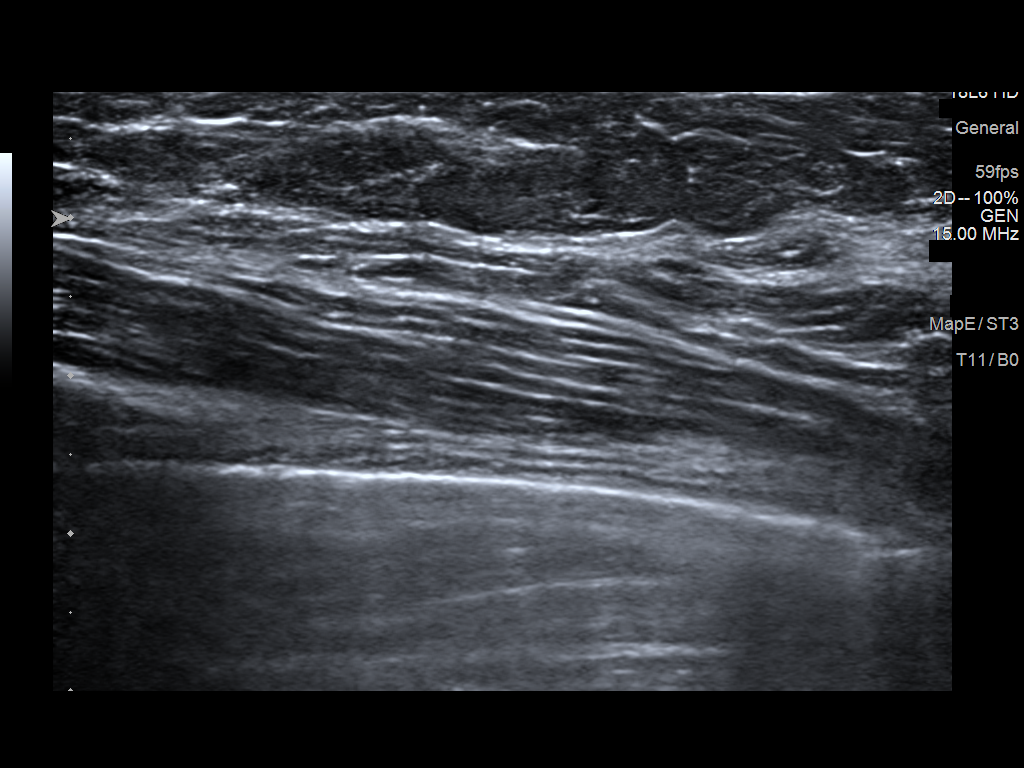

[3 of 3 positions shown; findings below may reference images not displayed]

ACR Breast Density Category b: There are scattered areas of
fibroglandular density.
FINDINGS: Tomosynthesis and synthesized full field CC and MLO views of both
breasts were obtained. Tomosynthesis and synthesized spot
compression tangential view of the area of concern in the RIGHT
breast was also obtained.

No mammographic abnormality in the area of palpable concern in the
UPPER OUTER RIGHT breast. Normal scattered fibroglandular tissue is
present in this location.

No findings suspicious for malignancy in either breast.

Mammographic images were processed with CAD.

On correlative physical exam, there is no palpable abnormality in
the axillary tail of the RIGHT breast in the area of focal pain. The
patient does describe tenderness to palpation.

Targeted RIGHT breast ultrasound is performed, showing normal
scattered fibroglandular tissue at the 11 o'clock position
approximately 7 cm from the nipple in the axillary tail in the area
of focal pain. No cyst, solid mass or abnormal acoustic shadowing is
identified.
IMPRESSION: 1. No mammographic or sonographic evidence of malignancy involving
the RIGHT breast.
2. No mammographic evidence of malignancy involving the LEFT breast.

RECOMMENDATION:
Screening mammogram in one year.(Code:[id number])

I have discussed the findings and recommendations with the patient.
If applicable, a reminder letter will be sent to the patient
regarding the next appointment.

BI-RADS CATEGORY  1: Negative.

## 2021-06-21 IMAGING — MG DIGITAL DIAGNOSTIC BILAT W/ TOMO W/ CAD
6 of 10 series · 6 of 30 positions shown · non-contrast
Comparison: Previous exam(s).

CLINICAL DATA: 54-year-old presenting with focal pain involving the
UPPER OUTER QUADRANT of the RIGHT breast over the past 4 months.
Annual evaluation, LEFT breast.

EXAM:
DIGITAL DIAGNOSTIC BILATERAL MAMMOGRAM WITH CAD AND TOMO
ULTRASOUND RIGHT BREAST

[R TAN synth-2D]
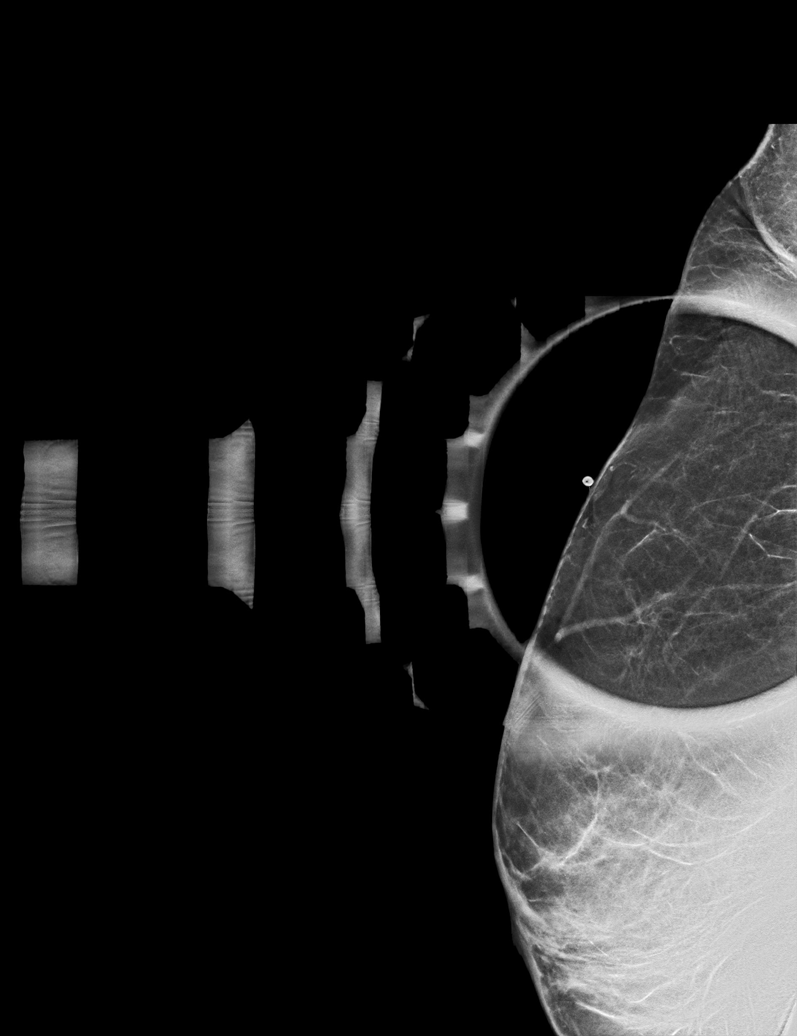

[R CC synth-2D]
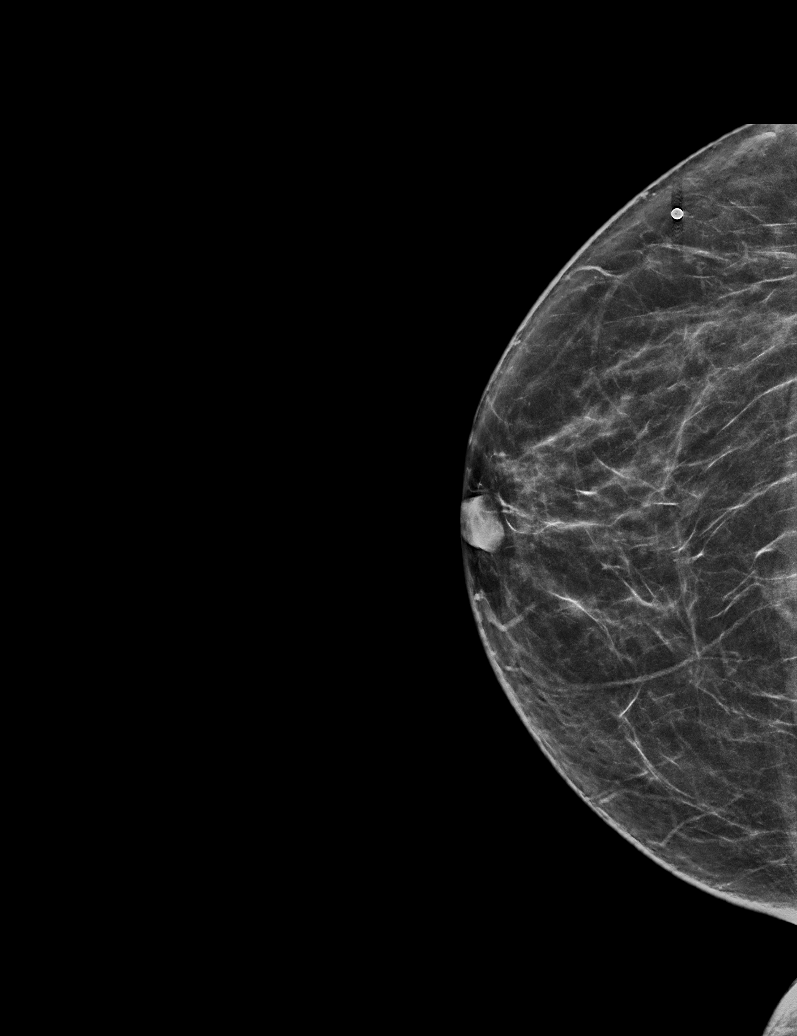

[L CC synth-2D]
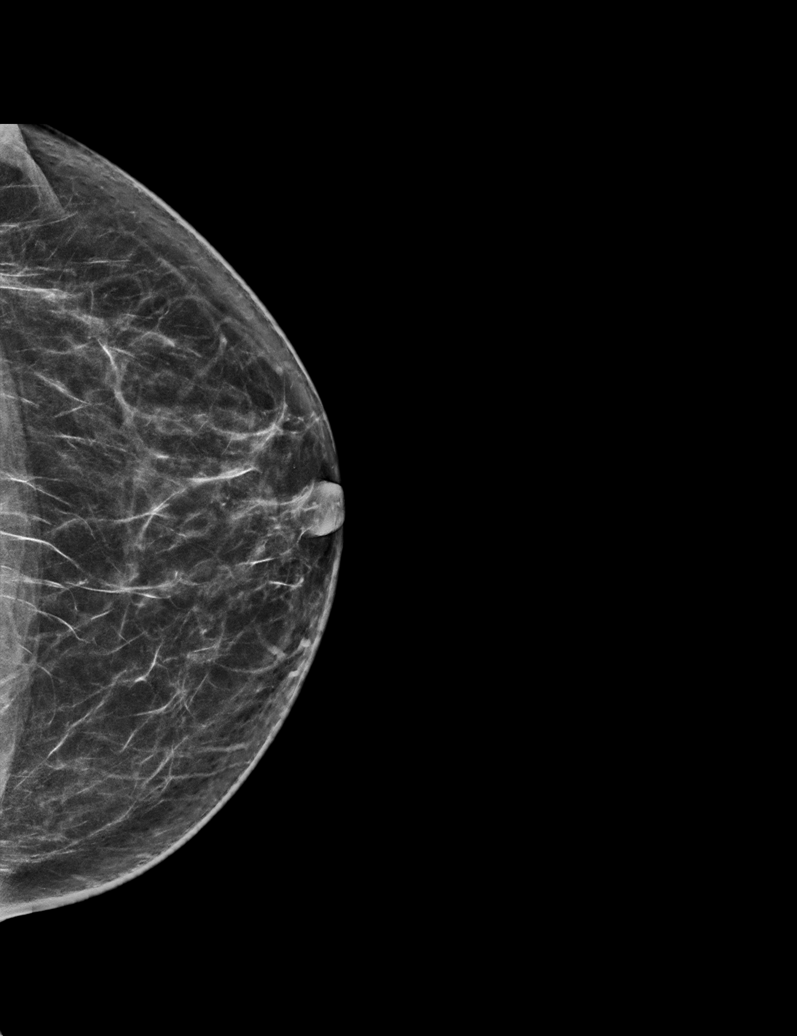

[R MLO synth-2D]
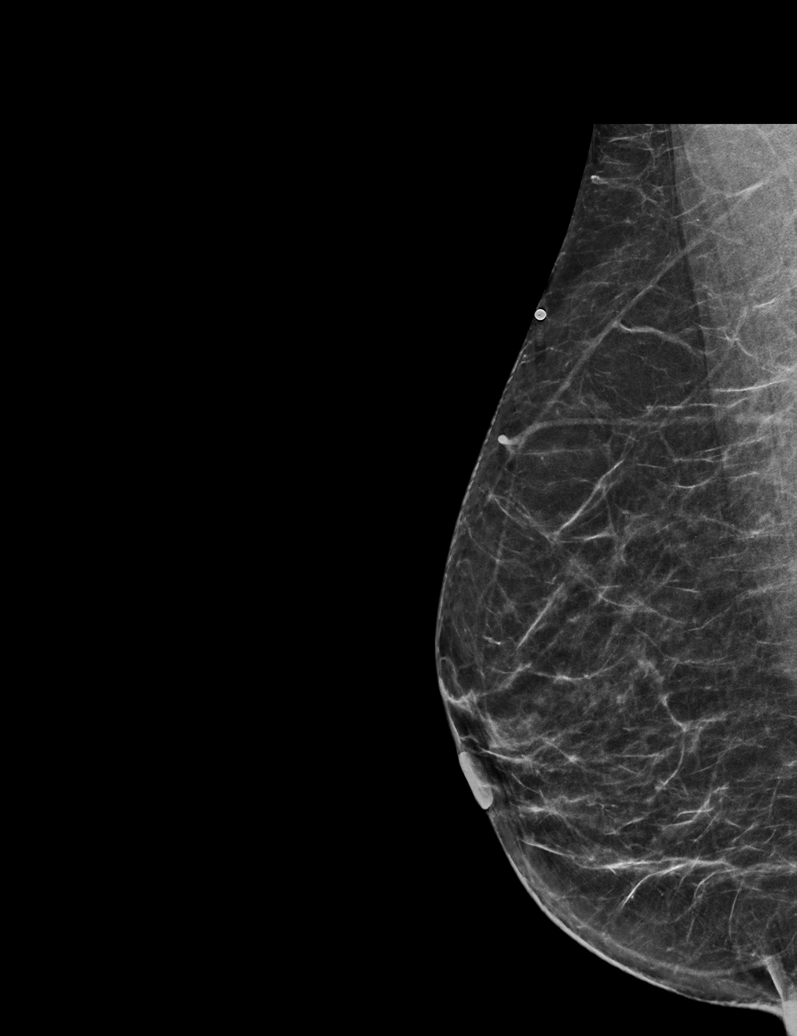

[L MLO synth-2D]
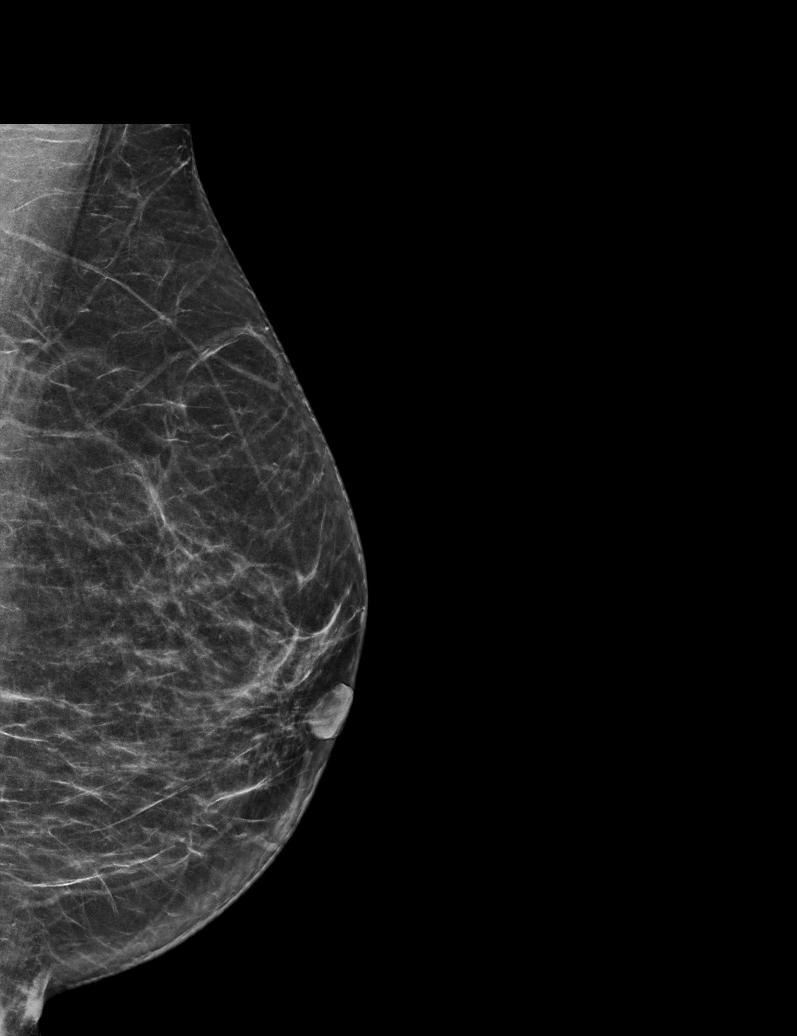

[R TAN tomo · tomo slice 21/41.0]
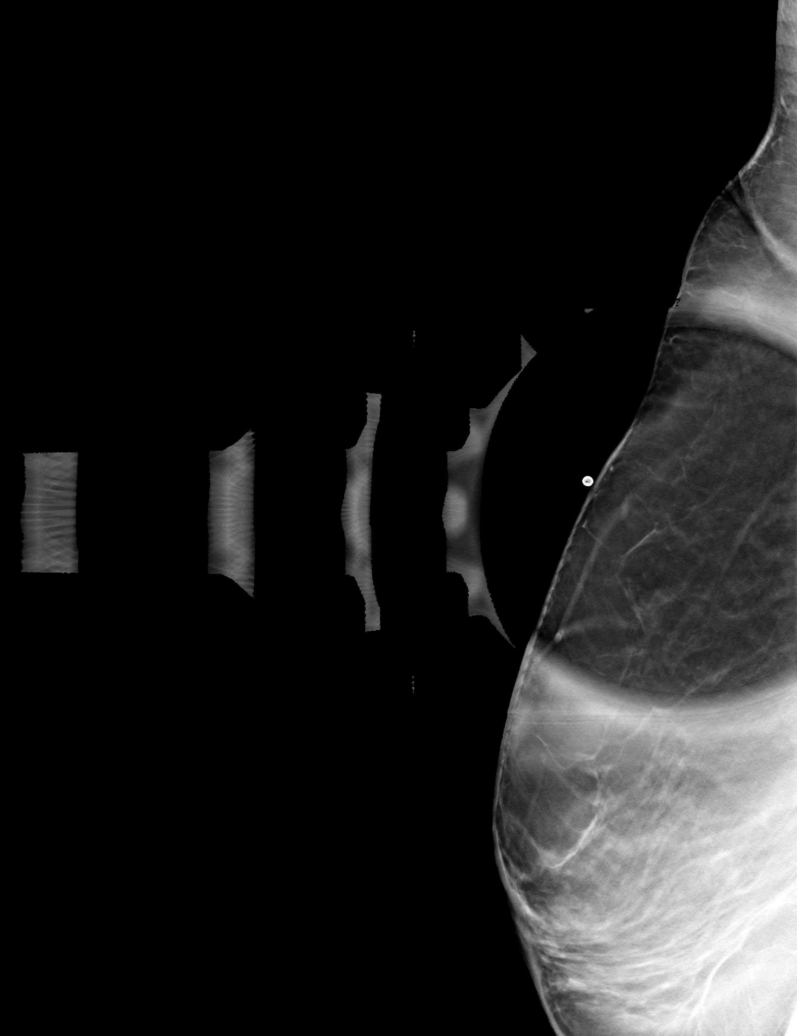

[6 of 30 positions shown; findings below may reference images not displayed]

ACR Breast Density Category b: There are scattered areas of
fibroglandular density.
FINDINGS: Tomosynthesis and synthesized full field CC and MLO views of both
breasts were obtained. Tomosynthesis and synthesized spot
compression tangential view of the area of concern in the RIGHT
breast was also obtained.

No mammographic abnormality in the area of palpable concern in the
UPPER OUTER RIGHT breast. Normal scattered fibroglandular tissue is
present in this location.

No findings suspicious for malignancy in either breast.

Mammographic images were processed with CAD.

On correlative physical exam, there is no palpable abnormality in
the axillary tail of the RIGHT breast in the area of focal pain. The
patient does describe tenderness to palpation.

Targeted RIGHT breast ultrasound is performed, showing normal
scattered fibroglandular tissue at the 11 o'clock position
approximately 7 cm from the nipple in the axillary tail in the area
of focal pain. No cyst, solid mass or abnormal acoustic shadowing is
identified.
IMPRESSION: 1. No mammographic or sonographic evidence of malignancy involving
the RIGHT breast.
2. No mammographic evidence of malignancy involving the LEFT breast.

RECOMMENDATION:
Screening mammogram in one year.(Code:[id number])

I have discussed the findings and recommendations with the patient.
If applicable, a reminder letter will be sent to the patient
regarding the next appointment.

BI-RADS CATEGORY  1: Negative.

## 2022-05-07 ENCOUNTER — Other Ambulatory Visit: Payer: Self-pay | Admitting: *Deleted

## 2022-05-07 DIAGNOSIS — Z1231 Encounter for screening mammogram for malignant neoplasm of breast: Secondary | ICD-10-CM

## 2022-06-29 ENCOUNTER — Ambulatory Visit
Admission: RE | Admit: 2022-06-29 | Discharge: 2022-06-29 | Disposition: A | Discharge status: Home / Self Care | Payer: BC Managed Care – PPO | Source: Ambulatory Visit | Attending: *Deleted | Admitting: *Deleted

## 2022-06-29 DIAGNOSIS — Z1231 Encounter for screening mammogram for malignant neoplasm of breast: Secondary | ICD-10-CM

## 2022-09-10 ENCOUNTER — Ambulatory Visit (INDEPENDENT_AMBULATORY_CARE_PROVIDER_SITE_OTHER): Payer: BC Managed Care – PPO | Admitting: Advanced Practice Midwife

## 2022-09-10 ENCOUNTER — Encounter: Payer: Self-pay | Admitting: Advanced Practice Midwife

## 2022-09-10 VITALS — BP 131/84 | HR 67 | Ht 65.0 in | Wt 151.0 lb

## 2022-09-10 DIAGNOSIS — N951 Menopausal and female climacteric states: Secondary | ICD-10-CM | POA: Diagnosis not present

## 2022-09-10 DIAGNOSIS — Z90710 Acquired absence of both cervix and uterus: Secondary | ICD-10-CM

## 2022-09-10 DIAGNOSIS — Z01419 Encounter for gynecological examination (general) (routine) without abnormal findings: Secondary | ICD-10-CM | POA: Diagnosis not present

## 2022-09-10 DIAGNOSIS — R102 Pelvic and perineal pain: Secondary | ICD-10-CM

## 2022-09-10 DIAGNOSIS — Z79890 Hormone replacement therapy (postmenopausal): Secondary | ICD-10-CM | POA: Diagnosis not present

## 2022-09-10 MED ORDER — ESTRADIOL 0.025 MG/24HR TD PTTW: 1.0000 | MEDICATED_PATCH | TRANSDERMAL | 12 refills | Status: DC

## 2022-09-10 NOTE — Progress Notes (Signed)
Subjective:     Katie Crawford is a 57 y.o. female here at [facility] for a routine exam.  Current complaints: RLQ intermittent sharp pain x 3-4 months.  Does not happen daily but is unusual for pt. Also, she reports insomnia, some night sweats, irritability r/t menopause.  Pt with symptoms since hysterectomy in 2012, improved with HRT in 2017, but pt stopped due to concerns about safety. She is interested in considering tx for symptoms again.  Personal health questionnaire reviewed: yes.  Do you have a primary care provider? yes Do you feel safe at home? yes  Flowsheet Row Office Visit from 06/03/[facility] in [facility]  PHQ-2 Total Score 0       Health Maintenance Due  Topic Date Due   HIV Screening  Never done   Hepatitis C Screening  Never done   DTaP/Tdap/Td (1 - Tdap) Never done   COLONOSCOPY (Pts 45-79yrs Insurance coverage will need to be confirmed)  Never done   Zoster Vaccines- Shingrix (1 of 2) Never done   PAP SMEAR-Modifier  11/15/2017   COVID-19 Vaccine (3 - 2023-24 season) 01/26/2022     Risk factors for chronic health problems: Smoking: never Alchohol/how much: occasional Pt BMI: Body mass index is 25.13 kg/m.   Gynecologic History No LMP recorded. Patient has had a hysterectomy. Contraception: status post hysterectomy Last Pap: Prior to hysterectomy in 2012. Results were: normal Last mammogram: 06/2022. Results were: normal  Obstetric History OB History  Gravida Para Term Preterm AB Living  SAB IAB Ectopic Multiple Live Births    1   1 0    # Outcome Date GA Lbr Len/2nd Weight Sex Delivery Anes PTL Lv  3A Term 1996     CS-Unspec     3B Term 1996     CS-Unspec     2 Term 29     Vag-Spont     1 IAB              The following portions of the patient's history were reviewed and updated as appropriate: allergies, current medications, past family history, past medical history, past social history, past surgical history, and  problem list.  Review of Systems Pertinent items noted in HPI and remainder of comprehensive ROS otherwise negative.    Objective:  BP 131/84   Pulse 67   Ht  (1.651 m)   Wt 151 lb (68.5 kg)   BMI 25.13 kg/m   VS reviewed, nursing note reviewed,  Constitutional: well developed, well nourished, no distress HEENT: normocephalic, thyroid without enlargement or mass HEART: RRR, no murmurs rubs/gallops RESP: clear and equal to auscultation bilaterally in all lobes  Breast Exam: exam performed: right breast normal without mass, skin or nipple changes or axillary nodes, left breast normal without mass, skin or nipple changes or axillary nodes Abdomen: soft Neuro: alert and oriented x 3 Skin: warm, dry Psych: affect normal Pelvic exam: Bimanual exam: surgically absent uterus, adnexa without tenderness, enlargement, or mass        Assessment/Plan:   1. Well woman exam with routine gynecological exam   2. Status post total abdominal hysterectomy   3. Vasomotor symptoms due to menopause --Reviewed options, including conservation measures, sleep aids, [place]. Pt had good success with 0.05 mg patch in 2017. Will start lower, f/u in 3 months to adjust as needed.  Pt without cardiac risks, nonsmoker, low BMI. - estradiol (VIVELLE-DOT) 0.025  MG/24HR; Place 1 patch onto the skin 2 (two) times a week.  Dispense: 8 patch; Refill: 12  4. Insomnia associated with menopause  - estradiol (VIVELLE-DOT) 0.025 MG/24HR; Place 1 patch onto the skin 2 (two) times a week.  Dispense: 8 patch; Refill: 12  5. Acute pelvic pain, female --RLQ pain, adnexa location --Exam today wnl, f/u with [place]  - US PELVIC COMPLETE WITH TRANSVAGINAL; Future     Return in about 3 months (around 12/10/2022) for MD only, Female provider preferred.   Sharen Counter, CNM 12:35 PM

## 2022-09-10 NOTE — Progress Notes (Signed)
GYN presents for AEX. Hysterectomy 08/2010 Last mammogram 07-02-22 Colonoscopy/cologuard last year. Declines STD testing.

## 2022-09-11 ENCOUNTER — Ambulatory Visit (HOSPITAL_BASED_OUTPATIENT_CLINIC_OR_DEPARTMENT_OTHER)
Admission: RE | Admit: 2022-09-11 | Discharge: 2022-09-11 | Disposition: A | Discharge status: Home / Self Care | Payer: BC Managed Care – PPO | Source: Ambulatory Visit | Attending: Advanced Practice Midwife | Admitting: Advanced Practice Midwife

## 2022-09-11 DIAGNOSIS — R102 Pelvic and perineal pain: Secondary | ICD-10-CM | POA: Diagnosis present

## 2022-09-13 NOTE — Addendum Note (Signed)
Addended by: Sharen Counter A on: 09/13/2022 08:47 AM   Modules accepted: Orders

## 2022-11-07 NOTE — Therapy (Signed)
OUTPATIENT PHYSICAL THERAPY FEMALE PELVIC EVALUATION   Patient Name: Katie Crawford MRN: [id number] DOB:1965-11-23, 57 y.o., female Today's Date: 11/08/2022  END OF SESSION:  PT End of Session - 11/08/22 1041     Visit Number 1    Date for PT Re-Evaluation 01/31/23    Authorization Type BCBS    PT Start Time 1035    PT Stop Time 1115    PT Time Calculation (min) 40 min    Activity Tolerance Patient tolerated treatment well    Behavior During Therapy WFL for tasks assessed/performed             Past Medical History:  Diagnosis Date   Cardiomyopathy (HCC)    CHF (congestive heart failure) (HCC)    High cholesterol    Past Surgical History:  Procedure Laterality Date   ABDOMINAL HYSTERECTOMY     CESAREAN SECTION     TUBAL LIGATION     Patient Active Problem List   Diagnosis Date Noted   Status post total abdominal hysterectomy 09/14/2019   Postmenopausal HRT (hormone replacement therapy) 11/23/2011    PCP: Marva Panda, NP  REFERRING PROVIDER: Hurshel Party, CNM   REFERRING DIAG: R10.2 (ICD-10-CM) - Acute pelvic pain, female   THERAPY DIAG:  Muscle weakness (generalized)  Cramp and spasm  Right lower quadrant abdominal pain  Rationale for Evaluation and Treatment: Rehabilitation  ONSET DATE: 2023  SUBJECTIVE:                                                                                                                                                                                           SUBJECTIVE STATEMENT:  RLQ intermittent sharp pain x 3-4 months and came on suddenly and started as a dull achy feeling.  Fluid intake: Yes: water, seltzer water, ginger ale, tea and coffee    PAIN:  Are you having pain? Yes NPRS scale: 6/10 Pain location:  right lower quadrant  Pain type: aching, dull Pain description: intermittent   Aggravating factors: comes on randomly Relieving factors: heat  PRECAUTIONS: None  WEIGHT BEARING  RESTRICTIONS: No  FALLS:  Has patient fallen in last 6 months? No  LIVING ENVIRONMENT: Lives with: lives with their family  OCCUPATION: Manufacturing systems engineer, chasing toddlers, sitting on the floor; strength training, cardio  PLOF: Independent  PATIENT GOALS: reduce pain  PERTINENT HISTORY:  Hysterectomy 2012; CHF/ Cesarean section   BOWEL MOVEMENT: Pain with bowel movement: No Type of bowel movement:Type (Bristol Stool Scale) Type 4 or 5, Frequency 1-2 days, Strain Yes, and Splinting no Fully empty rectum: No; when she does not drink all her water Leakage: No  Fiber supplement: No  URINATION: Pain with urination: No Fully empty bladder: Yes:   Stream: Strong Urgency: No Frequency: urinate every 2-3 hours Leakage:  none  INTERCOURSE: Pain with intercourse:  when dealing with vaginal dryness   PREGNANCY: C-section deliveries 2  PROLAPSE: None   OBJECTIVE:   DIAGNOSTIC FINDINGS:  none   COGNITION: Overall cognitive status: Within functional limits for tasks assessed     SENSATION: Light touch: Appears intact Proprioception: Appears intact    POSTURE: No Significant postural limitations  PELVIC ALIGNMENT: Correct pelvic alignment  LUMBARAROM/PROM: Lumbar ROM is full   LOWER EXTREMITY ROM:  Passive ROM Right eval Left eval  Hip external rotation 50 60   (Blank rows = not tested)  LOWER EXTREMITY MMT:  MMT Right eval Left eval  Hip flexion 4/5 5/5  Hip abduction 4/5 5/5   PALPATION:   General  restrictions in c-section scar, tenderness located in right lower quadrant, tightness in the right hip adductor; able to engage the left abdominal better than the right with SLR                External Perineal Exam intact                             Internal Pelvic Floor tenderness located in the right iliococcygeus and puborectalis  Patient confirms identification and approves PT to assess internal pelvic floor and treatment Yes  PELVIC MMT:    MMT eval  Vaginal 4/5  (Blank rows = not tested)        TONE: average    TODAY'S TREATMENT:                                                                                                                              DATE: 11/08/22  EVAL See below   PATIENT EDUCATION:  Education details: Access Code: [email or web address], how to massage the right pelvic floor muscles Person educated: Patient Education method: Explanation, Demonstration, Tactile cues, Verbal cues, and Handouts Education comprehension: verbalized understanding, returned demonstration, verbal cues required, tactile cues required, and needs further education  HOME EXERCISE PROGRAM: 11/08/22 Access Code: [email or web address] URL: [email or web address] Date: 11/08/2022 Prepared by: Eulis Foster  Exercises - Half Kneeling Hip Flexor Stretch with Sidebend  - 1 x daily - 7 x weekly - 2 sets - 2 reps - 30 sec hold  Patient Education - Trigger Point Dry Needling  ASSESSMENT:  CLINICAL IMPRESSION: Patient is a 57 y.o. female who was seen today for physical therapy evaluation and treatment for pelvic pain. Patient reports intermittent random right lower quadrant pain at level 6/10. She has palpable tenderness located on right lower quadrant, right iliococcygeus, and right puborectalis.  She has decreased right abdominal strength when performing right straight leg raise. Her right hip flexion and abduction is 4/5. She has tightness in the c-section scar. Patient would benefit from skilled therapy to improve  strength and reduce pain to improve quality of life.   OBJECTIVE IMPAIRMENTS: decreased strength, increased fascial restrictions, increased muscle spasms, and pain.   ACTIVITY LIMITATIONS: locomotion level and exercise  PARTICIPATION LIMITATIONS: community activity  PERSONAL FACTORS: 1-2 comorbidities: c-section, partial hysterectomy  are also affecting patient's functional outcome.   REHAB POTENTIAL: Excellent  CLINICAL  DECISION MAKING: Stable/uncomplicated  EVALUATION COMPLEXITY: Low   GOALS: Goals reviewed with patient? Yes  SHORT TERM GOALS: Target date: 12/05/22  Patient is independent with HEP for hip strength and stretches.  Baseline: Goal status: INITIAL  2.  Patient understands how to massage the right pelvic floor internally to reduce trigger points.  Baseline:  Goal status: INITIAL   LONG TERM GOALS: Target date: 01/31/23  Patient is independent with advanced HEP for hip and core strength to improve stability with her workouts.  Baseline:  Goal status: INITIAL  2.  Patient is able to contract the lower abdominals equally with a straight leg raise to reduce strain during her workouts.  Baseline:  Goal status: INITIAL  3.  Patient reports her right lower quadrant pain decreased to 0-1/10 due to improved strength and reduction of trigger points.  Baseline:  Goal status: INITIAL  4.  Education of vaginal moisturizers to improve vaginal health.  Baseline:  Goal status: INITIAL   PLAN:  PT FREQUENCY: 1x/week  PT DURATION: 12 weeks  PLANNED INTERVENTIONS: Therapeutic exercises, Therapeutic activity, Neuromuscular re-education, Patient/Family education, Joint mobilization, Dry Needling, Electrical stimulation, Cryotherapy, Moist heat, Taping, Ultrasound, Biofeedback, and Manual therapy  PLAN FOR NEXT SESSION: dry needling right abdominals and right hip adductors, manual work to the right pelvic floor and abdominals, right hip strenth   Eulis Foster, PT 11/08/22 2:02 PM

## 2022-11-08 ENCOUNTER — Encounter: Payer: BC Managed Care – PPO | Attending: Advanced Practice Midwife | Admitting: Physical Therapy

## 2022-11-08 ENCOUNTER — Other Ambulatory Visit: Payer: Self-pay

## 2022-11-08 ENCOUNTER — Encounter: Payer: Self-pay | Admitting: Physical Therapy

## 2022-11-08 DIAGNOSIS — R252 Cramp and spasm: Secondary | ICD-10-CM | POA: Insufficient documentation

## 2022-11-08 DIAGNOSIS — R1031 Right lower quadrant pain: Secondary | ICD-10-CM | POA: Insufficient documentation

## 2022-11-08 DIAGNOSIS — M6281 Muscle weakness (generalized): Secondary | ICD-10-CM | POA: Diagnosis present

## 2022-11-13 ENCOUNTER — Encounter: Payer: BC Managed Care – PPO | Admitting: Physical Therapy

## 2022-11-13 ENCOUNTER — Encounter: Payer: Self-pay | Admitting: Physical Therapy

## 2022-11-13 DIAGNOSIS — M6281 Muscle weakness (generalized): Secondary | ICD-10-CM | POA: Diagnosis not present

## 2022-11-13 DIAGNOSIS — R1031 Right lower quadrant pain: Secondary | ICD-10-CM

## 2022-11-13 DIAGNOSIS — R252 Cramp and spasm: Secondary | ICD-10-CM

## 2022-11-13 NOTE — Therapy (Signed)
OUTPATIENT PHYSICAL THERAPY FEMALE PELVIC EVALUATION   Patient Name: Katie Crawford MRN: [id number] DOB:January 27, 1966, 57 y.o., female Today's Date: 11/13/2022  END OF SESSION:  PT End of Session - 11/13/22 1502     Visit Number 2    Date for PT Re-Evaluation 01/31/23    Authorization Type BCBS    PT Start Time 1500    PT Stop Time 1545    PT Time Calculation (min) 45 min    Activity Tolerance Patient tolerated treatment well    Behavior During Therapy WFL for tasks assessed/performed             Past Medical History:  Diagnosis Date   Cardiomyopathy (HCC)    CHF (congestive heart failure) (HCC)    High cholesterol    Past Surgical History:  Procedure Laterality Date   ABDOMINAL HYSTERECTOMY     CESAREAN SECTION     TUBAL LIGATION     Patient Active Problem List   Diagnosis Date Noted   Status post total abdominal hysterectomy 09/14/2019   Postmenopausal HRT (hormone replacement therapy) 11/23/2011    PCP: Marva Panda, NP  REFERRING PROVIDER: Hurshel Party, CNM   REFERRING DIAG: R10.2 (ICD-10-CM) - Acute pelvic pain, female   THERAPY DIAG:  Muscle weakness (generalized)  Cramp and spasm  Right lower quadrant abdominal pain  Rationale for Evaluation and Treatment: Rehabilitation  ONSET DATE: 2023  SUBJECTIVE:                                                                                                                                                                                           SUBJECTIVE STATEMENT: I felt better after the initial eval. I went to the chiropractor yesterday due to long ride with back pain.  Fluid intake: Yes: water, seltzer water, ginger ale, tea and coffee    PAIN:  Are you having pain? Yes NPRS scale: 3/10 Pain location:  right lower quadrant  Pain type: aching, dull Pain description: intermittent   Aggravating factors: comes on randomly Relieving factors: heat  PRECAUTIONS: None  WEIGHT  BEARING RESTRICTIONS: No  FALLS:  Has patient fallen in last 6 months? No  LIVING ENVIRONMENT: Lives with: lives with their family  OCCUPATION: Manufacturing systems engineer, chasing toddlers, sitting on the floor; strength training, cardio  PLOF: Independent  PATIENT GOALS: reduce pain  PERTINENT HISTORY:  Hysterectomy 2012; CHF/ Cesarean section   BOWEL MOVEMENT: Pain with bowel movement: No Type of bowel movement:Type (Bristol Stool Scale) Type 4 or 5, Frequency 1-2 days, Strain Yes, and Splinting no Fully empty rectum: No; when she does not drink all her water Leakage:  No Fiber supplement: No  URINATION: Pain with urination: No Fully empty bladder: Yes:   Stream: Strong Urgency: No Frequency: urinate every 2-3 hours Leakage:  none  INTERCOURSE: Pain with intercourse:  when dealing with vaginal dryness   PREGNANCY: C-section deliveries 2  PROLAPSE: None   OBJECTIVE:   DIAGNOSTIC FINDINGS:  none   COGNITION: Overall cognitive status: Within functional limits for tasks assessed     SENSATION: Light touch: Appears intact Proprioception: Appears intact    POSTURE: No Significant postural limitations  PELVIC ALIGNMENT: Correct pelvic alignment  LUMBARAROM/PROM: Lumbar ROM is full   LOWER EXTREMITY ROM:  Passive ROM Right eval Left eval  Hip external rotation 50 60   (Blank rows = not tested)  LOWER EXTREMITY MMT:  MMT Right eval Left eval  Hip flexion 4/5 5/5  Hip abduction 4/5 5/5   PALPATION:   General  restrictions in c-section scar, tenderness located in right lower quadrant, tightness in the right hip adductor; able to engage the left abdominal better than the right with SLR                External Perineal Exam intact                             Internal Pelvic Floor tenderness located in the right iliococcygeus and puborectalis  Patient confirms identification and approves PT to assess internal pelvic floor and treatment Yes  PELVIC  MMT:   MMT eval  Vaginal 4/5  (Blank rows = not tested)        TONE: average    TODAY'S TREATMENT:  11/13/22 Manual: Soft tissue mobilization: To assess for dry needling Manual work to the right hip adductor, right quadricept, and right rectus and diaphragm Internal pelvic floor techniques: No emotional/communication barriers or cognitive limitation. Patient is motivated to learn. Patient understands and agrees with treatment goals and plan. PT explains patient will be examined in standing, sitting, and lying down to see how their muscles and joints work. When they are ready, they will be asked to remove their underwear so PT can examine their perineum. The patient is also given the option of providing their own chaperone as one is not provided in our facility. The patient also has the right and is explained the right to defer or refuse any part of the evaluation or treatment including the internal exam. With the patient's consent, PT will use one gloved finger to gently assess the muscles of the pelvic floor, seeing how well it contracts and relaxes and if there is muscle symmetry. After, the patient will get dressed and PT and patient will discuss exam findings and plan of care. PT and patient discuss plan of care, schedule, attendance policy and HEP activities.  Manual work to the right obturator internist with right hip movements.  Trigger Point Dry-Needling  Treatment instructions: Expect mild to moderate muscle soreness. S/S of pneumothorax if dry needled over a lung field, and to seek immediate medical attention should they occur. Patient verbalized understanding of these instructions and education.  Patient Consent Given: Yes Education handout provided: Yes Muscles treated: right quadriceps, right hip adductors, right rectus Electrical stimulation performed: No Parameters: N/A Treatment response/outcome: elongation of muscle and trigger point  response  Exercises: Stretches/mobility: Standing hip adductor stretch holding 10 sec 3 times each side Strengthening: Supine SLR with abdominal engagement 15 x each leg Modified side plank with tactile cues to engage the  gluteus medius, abdominals   PATIENT EDUCATION:  11/13/22 Education details: Access Code: [email or web address], how to massage the right pelvic floor muscles Person educated: Patient Education method: Explanation, Demonstration, Tactile cues, Verbal cues, and Handouts Education comprehension: verbalized understanding, returned demonstration, verbal cues required, tactile cues required, and needs further education  HOME EXERCISE PROGRAM: 11/13/22 Access Code: [email or web address] URL: [email or web address] Date: 11/13/2022 Prepared by: Eulis Foster  Exercises - Half Kneeling Hip Flexor Stretch with Sidebend  - 1 x daily - 7 x weekly - 2 sets - 2 reps - 30 sec hold - Supine Pelvic Tilt with Straight Leg Raise  - 1 x daily - 2 x weekly - 2 sets - 10 reps - Side Plank on Knees  - 1 x daily - 2 x weekly - 5-10 sets - 10 reps - Side Lunge Adductor Stretch  - 1 x daily - 7 x weekly - 1 sets - 5 reps  Patient Education - Trigger Point Dry Needling  ASSESSMENT:  CLINICAL IMPRESSION: Patient is a 57 y.o. female who was seen today for physical therapy  treatment for pelvic pain. Patient had no pain after therapy. She responded well with the dry needling and increased in muscle relaxation. Patient had trigger pont in the right obturator internist and no where else in the pelvic floor. Patient would benefit from skilled therapy to improve strength and reduce pain to improve quality of life.   OBJECTIVE IMPAIRMENTS: decreased strength, increased fascial restrictions, increased muscle spasms, and pain.   ACTIVITY LIMITATIONS: locomotion level and exercise  PARTICIPATION LIMITATIONS: community activity  PERSONAL FACTORS: 1-2 comorbidities: c-section, partial hysterectomy  are also  affecting patient's functional outcome.   REHAB POTENTIAL: Excellent  CLINICAL DECISION MAKING: Stable/uncomplicated  EVALUATION COMPLEXITY: Low   GOALS: Goals reviewed with patient? Yes  SHORT TERM GOALS: Target date: 12/05/22  Patient is independent with HEP for hip strength and stretches.  Baseline: Goal status: Met 11/13/22  2.  Patient understands how to massage the right pelvic floor internally to reduce trigger points.  Baseline:  Goal status: Met 11/13/22   LONG TERM GOALS: Target date: 01/31/23  Patient is independent with advanced HEP for hip and core strength to improve stability with her workouts.  Baseline:  Goal status: INITIAL  2.  Patient is able to contract the lower abdominals equally with a straight leg raise to reduce strain during her workouts.  Baseline:  Goal status: INITIAL  3.  Patient reports her right lower quadrant pain decreased to 0-1/10 due to improved strength and reduction of trigger points.  Baseline:  Goal status: INITIAL  4.  Education of vaginal moisturizers to improve vaginal health.  Baseline:  Goal status: INITIAL   PLAN:  PT FREQUENCY: 1x/week  PT DURATION: 12 weeks  PLANNED INTERVENTIONS: Therapeutic exercises, Therapeutic activity, Neuromuscular re-education, Patient/Family education, Joint mobilization, Dry Needling, Electrical stimulation, Cryotherapy, Moist heat, Taping, Ultrasound, Biofeedback, and Manual therapy  PLAN FOR NEXT SESSION: dry needling right hip adductors, quadreps, right hip strength   Eulis Foster, PT 11/13/22 3:55 PM

## 2022-11-20 ENCOUNTER — Encounter: Payer: Self-pay | Admitting: Physical Therapy

## 2022-11-20 ENCOUNTER — Encounter: Payer: BC Managed Care – PPO | Admitting: Physical Therapy

## 2022-11-20 DIAGNOSIS — R1031 Right lower quadrant pain: Secondary | ICD-10-CM

## 2022-11-20 DIAGNOSIS — R252 Cramp and spasm: Secondary | ICD-10-CM

## 2022-11-20 DIAGNOSIS — M6281 Muscle weakness (generalized): Secondary | ICD-10-CM

## 2022-11-20 NOTE — Therapy (Signed)
OUTPATIENT PHYSICAL THERAPY FEMALE PELVIC EVALUATION   Patient Name: Katie Crawford MRN: [id number] DOB:1965-06-21, 57 y.o., female Today's Date: 11/20/2022  END OF SESSION:  PT End of Session - 11/20/22 1409     Visit Number 3    Date for PT Re-Evaluation 01/31/23    Authorization Type BCBS    PT Start Time 1405    PT Stop Time 1450    PT Time Calculation (min) 45 min    Activity Tolerance Patient tolerated treatment well    Behavior During Therapy WFL for tasks assessed/performed             Past Medical History:  Diagnosis Date   Cardiomyopathy (HCC)    CHF (congestive heart failure) (HCC)    High cholesterol    Past Surgical History:  Procedure Laterality Date   ABDOMINAL HYSTERECTOMY     CESAREAN SECTION     TUBAL LIGATION     Patient Active Problem List   Diagnosis Date Noted   Status post total abdominal hysterectomy 09/14/2019   Postmenopausal HRT (hormone replacement therapy) 11/23/2011    PCP: Marva Panda, NP  REFERRING PROVIDER: Hurshel Party, CNM   REFERRING DIAG: R10.2 (ICD-10-CM) - Acute pelvic pain, female   THERAPY DIAG:  Cramp and spasm  Muscle weakness (generalized)  Right lower quadrant abdominal pain  Rationale for Evaluation and Treatment: Rehabilitation  ONSET DATE: 2023  SUBJECTIVE:                                                                                                                                                                                           SUBJECTIVE STATEMENT: I was feeling better. I have a little discomfort. I have had some periods of no achy feeling.     PAIN:  Are you having pain? Yes NPRS scale: 3/10 Pain location:  right lower quadrant  Pain type: aching, dull Pain description: intermittent   Aggravating factors: comes on randomly Relieving factors: heat  PRECAUTIONS: None  WEIGHT BEARING RESTRICTIONS: No  FALLS:  Has patient fallen in last 6 months?  No  LIVING ENVIRONMENT: Lives with: lives with their family  OCCUPATION: Manufacturing systems engineer, chasing toddlers, sitting on the floor; strength training, cardio  PLOF: Independent  PATIENT GOALS: reduce pain  PERTINENT HISTORY:  Hysterectomy 2012; CHF/ Cesarean section   BOWEL MOVEMENT: Pain with bowel movement: No Type of bowel movement:Type (Bristol Stool Scale) Type 4 or 5, Frequency 1-2 days, Strain Yes, and Splinting no Fully empty rectum: No; when she does not drink all her water Leakage: No Fiber supplement: No  URINATION: Pain with urination: No Fully empty bladder:  Yes:   Stream: Strong Urgency: No Frequency: urinate every 2-3 hours Leakage:  none  INTERCOURSE: Pain with intercourse:  when dealing with vaginal dryness   PREGNANCY: C-section deliveries 2  PROLAPSE: None   OBJECTIVE:   DIAGNOSTIC FINDINGS:  none   COGNITION: Overall cognitive status: Within functional limits for tasks assessed     SENSATION: Light touch: Appears intact Proprioception: Appears intact    POSTURE: No Significant postural limitations  PELVIC ALIGNMENT: Correct pelvic alignment  LUMBARAROM/PROM: Lumbar ROM is full   LOWER EXTREMITY ROM:  Passive ROM Right eval Left eval  Hip external rotation 50 60   (Blank rows = not tested)  LOWER EXTREMITY MMT:  MMT Right eval Left eval  Hip flexion 4/5 5/5  Hip abduction 4/5 5/5   PALPATION:   General  restrictions in c-section scar, tenderness located in right lower quadrant, tightness in the right hip adductor; able to engage the left abdominal better than the right with SLR                External Perineal Exam intact                             Internal Pelvic Floor tenderness located in the right iliococcygeus and puborectalis  Patient confirms identification and approves PT to assess internal pelvic floor and treatment Yes  PELVIC MMT:   MMT eval  Vaginal 4/5  (Blank rows = not tested)         TONE: average    TODAY'S TREATMENT:  11/20/22 Manual: Soft tissue mobilization: To assess for dry needling Manual work to the right quadratus, right hip adductor and rectus to elongate after dry needling Internal pelvic floor techniques: No emotional/communication barriers or cognitive limitation. Patient is motivated to learn. Patient understands and agrees with treatment goals and plan. PT explains patient will be examined in standing, sitting, and lying down to see how their muscles and joints work. When they are ready, they will be asked to remove their underwear so PT can examine their perineum. The patient is also given the option of providing their own chaperone as one is not provided in our facility. The patient also has the right and is explained the right to defer or refuse any part of the evaluation or treatment including the internal exam. With the patient's consent, PT will use one gloved finger to gently assess the muscles of the pelvic floor, seeing how well it contracts and relaxes and if there is muscle symmetry. After, the patient will get dressed and PT and patient will discuss exam findings and plan of care. PT and patient discuss plan of care, schedule, attendance policy and HEP activities.  Going through the vagina and working in the right obturator internist Trigger Point Dry-Needling  Treatment instructions: Expect mild to moderate muscle soreness. S/S of pneumothorax if dry needled over a lung field, and to seek immediate medical attention should they occur. Patient verbalized understanding of these instructions and education.  Patient Consent Given: Yes Education handout provided: Yes Muscles treated: right quadriceps, right hip adductors, right rectus Electrical stimulation performed: No Parameters: N/A Treatment response/outcome: elongation of muscle and trigger point response  Exercises: Stretches/mobility: Educated patient on how to place vaginal moisturizers  on the vulva area and to massage in to improve health of tissue.     11/13/22 Manual: Soft tissue mobilization: To assess for dry needling Manual work to the right hip adductor,  right quadricept, and right rectus and diaphragm Internal pelvic floor techniques: No emotional/communication barriers or cognitive limitation. Patient is motivated to learn. Patient understands and agrees with treatment goals and plan. PT explains patient will be examined in standing, sitting, and lying down to see how their muscles and joints work. When they are ready, they will be asked to remove their underwear so PT can examine their perineum. The patient is also given the option of providing their own chaperone as one is not provided in our facility. The patient also has the right and is explained the right to defer or refuse any part of the evaluation or treatment including the internal exam. With the patient's consent, PT will use one gloved finger to gently assess the muscles of the pelvic floor, seeing how well it contracts and relaxes and if there is muscle symmetry. After, the patient will get dressed and PT and patient will discuss exam findings and plan of care. PT and patient discuss plan of care, schedule, attendance policy and HEP activities.  Manual work to the right obturator internist with right hip movements.  Trigger Point Dry-Needling  Treatment instructions: Expect mild to moderate muscle soreness. S/S of pneumothorax if dry needled over a lung field, and to seek immediate medical attention should they occur. Patient verbalized understanding of these instructions and education.  Patient Consent Given: Yes Education handout provided: Yes Muscles treated: right quadriceps, right hip adductors, right rectus Electrical stimulation performed: No Parameters: N/A Treatment response/outcome: elongation of muscle and trigger point response  Exercises: Stretches/mobility: Standing hip adductor stretch  holding 10 sec 3 times each side Strengthening: Supine SLR with abdominal engagement 15 x each leg Modified side plank with tactile cues to engage the gluteus medius, abdominals   PATIENT EDUCATION:  11/13/22 Education details: Access Code: [email or web address], how to massage the right pelvic floor muscles Person educated: Patient Education method: Explanation, Demonstration, Tactile cues, Verbal cues, and Handouts Education comprehension: verbalized understanding, returned demonstration, verbal cues required, tactile cues required, and needs further education  HOME EXERCISE PROGRAM: 11/13/22 Access Code: [email or web address] URL: [email or web address] Date: 11/13/2022 Prepared by: Eulis Foster  Exercises - Half Kneeling Hip Flexor Stretch with Sidebend  - 1 x daily - 7 x weekly - 2 sets - 2 reps - 30 sec hold - Supine Pelvic Tilt with Straight Leg Raise  - 1 x daily - 2 x weekly - 2 sets - 10 reps - Side Plank on Knees  - 1 x daily - 2 x weekly - 5-10 sets - 10 reps - Side Lunge Adductor Stretch  - 1 x daily - 7 x weekly - 1 sets - 5 reps  Patient Education - Trigger Point Dry Needling  ASSESSMENT:  CLINICAL IMPRESSION: Patient is a 57 y.o. female who was seen today for physical therapy  treatment for pelvic pain. Patient felt better after last visit but this morning she had increased in right groin pain. Patient had no pain after manual work and dry needling. She understands how to massage the vaginal moisturizers into the vulva area to improve health. Patient would benefit from skilled therapy to improve strength and reduce pain to improve quality of life.   OBJECTIVE IMPAIRMENTS: decreased strength, increased fascial restrictions, increased muscle spasms, and pain.   ACTIVITY LIMITATIONS: locomotion level and exercise  PARTICIPATION LIMITATIONS: community activity  PERSONAL FACTORS: 1-2 comorbidities: c-section, partial hysterectomy  are also affecting patient's functional outcome.    REHAB POTENTIAL: Excellent  CLINICAL DECISION MAKING: Stable/uncomplicated  EVALUATION  COMPLEXITY: Low   GOALS: Goals reviewed with patient? Yes  SHORT TERM GOALS: Target date: 12/05/22  Patient is independent with HEP for hip strength and stretches.  Baseline: Goal status: Met 11/13/22  2.  Patient understands how to massage the right pelvic floor internally to reduce trigger points.  Baseline:  Goal status: Met 11/13/22   LONG TERM GOALS: Target date: 01/31/23  Patient is independent with advanced HEP for hip and core strength to improve stability with her workouts.  Baseline:  Goal status: INITIAL  2.  Patient is able to contract the lower abdominals equally with a straight leg raise to reduce strain during her workouts.  Baseline:  Goal status: INITIAL  3.  Patient reports her right lower quadrant pain decreased to 0-1/10 due to improved strength and reduction of trigger points.  Baseline:  Goal status: INITIAL  4.  Education of vaginal moisturizers to improve vaginal health.  Baseline:  Goal status: met 11/20/22   PLAN:  PT FREQUENCY: 1x/week  PT DURATION: 12 weeks  PLANNED INTERVENTIONS: Therapeutic exercises, Therapeutic activity, Neuromuscular re-education, Patient/Family education, Joint mobilization, Dry Needling, Electrical stimulation, Cryotherapy, Moist heat, Taping, Ultrasound, Biofeedback, and Manual therapy  PLAN FOR NEXT SESSION: dry needling right hip adductors, quadreps, update HEP for core and hip strength   Eulis Foster, PT 11/20/22 2:54 PM

## 2022-11-20 NOTE — Patient Instructions (Signed)
Moisturizers They are used in the vagina to hydrate the mucous membrane that make up the vaginal canal. Designed to keep a more normal acid balance (ph) Once placed in the vagina, it will last between two to three days.  Use 2-3 times per week at bedtime  Ingredients to avoid is glycerin and fragrance, can increase chance of infection Should not be used just before sex due to causing irritation Most are gels administered either in a tampon-shaped applicator or as a vaginal suppository. They are non-hormonal.   Types of Moisturizers(internal use)  Vitamin E vaginal suppositories- Whole foods, Amazon Moist Again Coconut oil- can break down condoms Julva- (Do no use if on Tamoxifen) amazon Yes moisturizer- amazon NeuEve Silk , NeuEve Silver for menopausal or over 65 (if have severe vaginal atrophy or cancer treatments use NeuEve Silk for  1 month than move to NeuEve Silver)- Amazon, Neuve.com Olive and Bee intimate cream- www.oliveandbee.com.au Mae vaginal moisturizer- Amazon Aloe Good Clean Love Hyaluronic acid Hyalofemme replens   Creams to use externally on the Vulva area Desert Harvest Releveum (good for for cancer patients that had radiation to the area)- amazon or www.desertharvest.com V-magic cream - amazon Julva-amazon Vital "V Wild Yam salve ( help moisturize and help with thinning vulvar area, does have Beeswax MoodMaid Botanical Pro-Meno Wild Yam Cream- Amazon Desert Harvest Gele Cleo by Damiva labial moisturizer (Amazon,  Coconut or olive oil aloe Good Clean Love Enchanted Rose by intimate rose  Things to avoid in the vaginal area Do not use things to irritate the vulvar area No lotions just specialized creams for the vulva area- Neogyn, V-magic, No soaps; can use Aveeno or Calendula cleanser if needed. Must be gentle No deodorants No douches Good to sleep without underwear to let the vaginal area to air out No scrubbing: spread the lips to let warm water rinse  over labias and pat dry   Acel Natzke, PT [facility] [address], [address] [place], [place] [address] W: [phone] [email or web address]  

## 2022-11-27 ENCOUNTER — Encounter: Payer: BC Managed Care – PPO | Attending: Advanced Practice Midwife | Admitting: Physical Therapy

## 2022-11-27 ENCOUNTER — Encounter: Payer: Self-pay | Admitting: Physical Therapy

## 2022-11-27 DIAGNOSIS — R1031 Right lower quadrant pain: Secondary | ICD-10-CM | POA: Insufficient documentation

## 2022-11-27 DIAGNOSIS — M6281 Muscle weakness (generalized): Secondary | ICD-10-CM | POA: Insufficient documentation

## 2022-11-27 DIAGNOSIS — R252 Cramp and spasm: Secondary | ICD-10-CM | POA: Insufficient documentation

## 2022-11-27 NOTE — Therapy (Signed)
OUTPATIENT PHYSICAL THERAPY FEMALE PELVIC EVALUATION   Patient Name: Katie Crawford MRN: [id number] DOB:November 07, 1965, 57 y.o., female Today's Date: 11/27/2022  END OF SESSION:  PT End of Session - 11/27/22 1508     Visit Number 4    Date for PT Re-Evaluation 01/31/23    Authorization Type [employer]    PT Start Time 1500    PT Stop Time 1545    PT Time Calculation (min) 45 min    Activity Tolerance Patient tolerated treatment well    Behavior During Therapy WFL for tasks assessed/performed             Past Medical History:  Diagnosis Date   Cardiomyopathy (HCC)    CHF (congestive heart failure) (HCC)    High cholesterol    Past Surgical History:  Procedure Laterality Date   ABDOMINAL HYSTERECTOMY     CESAREAN SECTION     TUBAL LIGATION     Patient Active Problem List   Diagnosis Date Noted   Status post total abdominal hysterectomy 09/14/2019   Postmenopausal HRT (hormone replacement therapy) 11/23/2011    PCP: Marva Panda, NP  REFERRING PROVIDER: Hurshel Party, CNM   REFERRING DIAG: R10.2 (ICD-10-CM) - Acute pelvic pain, female   THERAPY DIAG:  Cramp and spasm  Muscle weakness (generalized)  Right lower quadrant abdominal pain  Rationale for Evaluation and Treatment: Rehabilitation  ONSET DATE: 2023  SUBJECTIVE:                                                                                                                                                                                           SUBJECTIVE STATEMENT: I feel good. I am doing my exercises. My legs was not as sore. The pain is improving. Not in pain today. The lunge stretch helps.      PAIN:  Are you having pain? Yes NPRS scale: 3/10 Pain location:  right lower quadrant  Pain type: aching, dull Pain description: intermittent   Aggravating factors: comes on randomly Relieving factors: heat  PRECAUTIONS: None  WEIGHT BEARING RESTRICTIONS: No  FALLS:  Has patient  fallen in last 6 months? No  LIVING ENVIRONMENT: Lives with: lives with their family  OCCUPATION: Manufacturing systems engineer, chasing toddlers, sitting on the floor; strength training, cardio  PLOF: Independent  PATIENT GOALS: reduce pain  PERTINENT HISTORY:  Hysterectomy 2012; CHF/ Cesarean section   BOWEL MOVEMENT: Pain with bowel movement: No Type of bowel movement:Type (Bristol Stool Scale) Type 4 or 5, Frequency 1-2 days, Strain Yes, and Splinting no Fully empty rectum: No; when she does not drink all her water Leakage: No Fiber supplement: No  URINATION: Pain with urination: No Fully empty bladder: Yes:   Stream: Strong Urgency: No Frequency: urinate every 2-3 hours Leakage:  none  INTERCOURSE: Pain with intercourse:  when dealing with vaginal dryness   PREGNANCY: C-section deliveries 2  PROLAPSE: None   OBJECTIVE:   DIAGNOSTIC FINDINGS:  none   COGNITION: Overall cognitive status: Within functional limits for tasks assessed     SENSATION: Light touch: Appears intact Proprioception: Appears intact    POSTURE: No Significant postural limitations  PELVIC ALIGNMENT: Correct pelvic alignment  LUMBARAROM/PROM: Lumbar ROM is full   LOWER EXTREMITY ROM:  Passive ROM Right eval Left eval  Hip external rotation 50 60   (Blank rows = not tested)  LOWER EXTREMITY MMT:  MMT Right eval Left eval  Hip flexion 4/5 5/5  Hip abduction 4/5 5/5   PALPATION:   General  restrictions in c-section scar, tenderness located in right lower quadrant, tightness in the right hip adductor; able to engage the left abdominal better than the right with SLR                External Perineal Exam intact                             Internal Pelvic Floor tenderness located in the right iliococcygeus and puborectalis  Patient confirms identification and approves PT to assess internal pelvic floor and treatment Yes  PELVIC MMT:   MMT eval  Vaginal 4/5  (Blank rows =  not tested)        TONE: average    TODAY'S TREATMENT:  11/27/22 Manual: Soft tissue mobilization: Manual work to the right adductor, quadriceps and hamstring to elongate muscles Exercises: Stretches/mobility: Hip adductor stretch moving your foot back and forth then rock hips back and forth Sitting hamstring stretch one leg at a times holding 30 sec bending at hips not at waist Educated patient on vaginal moisturizers, how to put them on the vulvar area and into the vaginal canal.  Strengthening: Stand trunk twist with hip flexion Supine curl up with tactile cues to contract the abdominals with keeping knees together Supine lift leg double, then one leg then just with bent leg    11/20/22 Manual: Soft tissue mobilization: To assess for dry needling Manual work to the right quadratus, right hip adductor and rectus to elongate after dry needling Internal pelvic floor techniques: No emotional/communication barriers or cognitive limitation. Patient is motivated to learn. Patient understands and agrees with treatment goals and plan. PT explains patient will be examined in standing, sitting, and lying down to see how their muscles and joints work. When they are ready, they will be asked to remove their underwear so PT can examine their perineum. The patient is also given the option of providing their own chaperone as one is not provided in our facility. The patient also has the right and is explained the right to defer or refuse any part of the evaluation or treatment including the internal exam. With the patient's consent, PT will use one gloved finger to gently assess the muscles of the pelvic floor, seeing how well it contracts and relaxes and if there is muscle symmetry. After, the patient will get dressed and PT and patient will discuss exam findings and plan of care. PT and patient discuss plan of care, schedule, attendance policy and HEP activities.  Going through the vagina and  working in the right Probation officer  Treatment instructions: Expect mild to moderate muscle soreness. S/S of pneumothorax if dry needled over a lung field, and to seek immediate medical attention should they occur. Patient verbalized understanding of these instructions and education.  Patient Consent Given: Yes Education handout provided: Yes Muscles treated: right quadriceps, right hip adductors, right rectus Electrical stimulation performed: No Parameters: N/A Treatment response/outcome: elongation of muscle and trigger point response  Exercises: Stretches/mobility: Educated patient on how to place vaginal moisturizers on the vulva area and to massage in to improve health of tissue.     11/13/22 Manual: Soft tissue mobilization: To assess for dry needling Manual work to the right hip adductor, right quadricept, and right rectus and diaphragm Internal pelvic floor techniques: No emotional/communication barriers or cognitive limitation. Patient is motivated to learn. Patient understands and agrees with treatment goals and plan. PT explains patient will be examined in standing, sitting, and lying down to see how their muscles and joints work. When they are ready, they will be asked to remove their underwear so PT can examine their perineum. The patient is also given the option of providing their own chaperone as one is not provided in our facility. The patient also has the right and is explained the right to defer or refuse any part of the evaluation or treatment including the internal exam. With the patient's consent, PT will use one gloved finger to gently assess the muscles of the pelvic floor, seeing how well it contracts and relaxes and if there is muscle symmetry. After, the patient will get dressed and PT and patient will discuss exam findings and plan of care. PT and patient discuss plan of care, schedule, attendance policy and HEP activities.  Manual work  to the right obturator internist with right hip movements.  Trigger Point Dry-Needling  Treatment instructions: Expect mild to moderate muscle soreness. S/S of pneumothorax if dry needled over a lung field, and to seek immediate medical attention should they occur. Patient verbalized understanding of these instructions and education.  Patient Consent Given: Yes Education handout provided: Yes Muscles treated: right quadriceps, right hip adductors, right rectus Electrical stimulation performed: No Parameters: N/A Treatment response/outcome: elongation of muscle and trigger point response  Exercises: Stretches/mobility: Standing hip adductor stretch holding 10 sec 3 times each side Strengthening: Supine SLR with abdominal engagement 15 x each leg Modified side plank with tactile cues to engage the gluteus medius, abdominals   PATIENT EDUCATION:  11/27/22 Education details: Access Code: [email or web address], how to massage the right pelvic floor muscles, educated patient on vaginal moisturizers Person educated: Patient Education method: Explanation, Demonstration, Tactile cues, Verbal cues, and Handouts Education comprehension: verbalized understanding, returned demonstration, verbal cues required, tactile cues required, and needs further education  HOME EXERCISE PROGRAM: 11/13/22 Access Code: [email or web address] URL: [email or web address] Date: 11/13/2022 Prepared by: Eulis Foster  Exercises - Half Kneeling Hip Flexor Stretch with Sidebend  - 1 x daily - 7 x weekly - 2 sets - 2 reps - 30 sec hold - Supine Pelvic Tilt with Straight Leg Raise  - 1 x daily - 2 x weekly - 2 sets - 10 reps - Side Plank on Knees  - 1 x daily - 2 x weekly - 5-10 sets - 10 reps - Side Lunge Adductor Stretch  - 1 x daily - 7 x weekly - 1 sets - 5 reps  Patient Education - Trigger Point Dry Needling  ASSESSMENT:  CLINICAL IMPRESSION: Patient is a 57 y.o. female who was seen today for physical therapy  treatment  for pelvic pain. Patient is having less less pain. She is learning how to engage her abdominals correctly to stabilize her core with leg movement and understand to not bulge her abdomen. She is able to contract her abdominals equally with left and right side. She has increased muscle mobility for the hip adductor, hamstring and quadriceps. Patient would benefit from skilled therapy to improve strength and reduce pain to improve quality of life.   OBJECTIVE IMPAIRMENTS: decreased strength, increased fascial restrictions, increased muscle spasms, and pain.   ACTIVITY LIMITATIONS: locomotion level and exercise  PARTICIPATION LIMITATIONS: community activity  PERSONAL FACTORS: 1-2 comorbidities: c-section, partial hysterectomy  are also affecting patient's functional outcome.   REHAB POTENTIAL: Excellent  CLINICAL DECISION MAKING: Stable/uncomplicated  EVALUATION COMPLEXITY: Low   GOALS: Goals reviewed with patient? Yes  SHORT TERM GOALS: Target date: 12/05/22  Patient is independent with HEP for hip strength and stretches.  Baseline: Goal status: Met 11/13/22  2.  Patient understands how to massage the right pelvic floor internally to reduce trigger points.  Baseline:  Goal status: Met 11/13/22   LONG TERM GOALS: Target date: 01/31/23  Patient is independent with advanced HEP for hip and core strength to improve stability with her workouts.  Baseline:  Goal status: INITIAL  2.  Patient is able to contract the lower abdominals equally with a straight leg raise to reduce strain during her workouts.  Baseline:  Goal status: INITIAL  3.  Patient reports her right lower quadrant pain decreased to 0-1/10 due to improved strength and reduction of trigger points.  Baseline:  Goal status: INITIAL  4.  Education of vaginal moisturizers to improve vaginal health.  Baseline:  Goal status: met 11/20/22   PLAN:  PT FREQUENCY: 1x/week  PT DURATION: 12 weeks  PLANNED INTERVENTIONS:  Therapeutic exercises, Therapeutic activity, Neuromuscular re-education, Patient/Family education, Joint mobilization, Dry Needling, Electrical stimulation, Cryotherapy, Moist heat, Taping, Ultrasound, Biofeedback, and Manual therapy  PLAN FOR NEXT SESSION: dry needling right hip adductors,  update HEP for core and hip strength   Eulis Foster, PT 11/27/22 4:05 PM

## 2022-11-27 NOTE — Patient Instructions (Signed)
Moisturizers They are used in the vagina to hydrate the mucous membrane that make up the vaginal canal. Designed to keep a more normal acid balance (ph) Once placed in the vagina, it will last between two to three days.  Use 2-3 times per week at bedtime  Ingredients to avoid is glycerin and fragrance, can increase chance of infection Should not be used just before sex due to causing irritation Most are gels administered either in a tampon-shaped applicator or as a vaginal suppository. They are non-hormonal.   Types of Moisturizers(internal use)  Vitamin E vaginal suppositories- Whole foods, Amazon Moist Again Coconut oil- can break down condoms Julva- (Do no use if on Tamoxifen) amazon Yes moisturizer- amazon NeuEve Silk , NeuEve Silver for menopausal or over 65 (if have severe vaginal atrophy or cancer treatments use NeuEve Silk for  1 month than move to NeuEve Silver)- Amazon, Neuve.com Olive and Bee intimate cream- [email or web address] Mae vaginal moisturizer- Amazon Aloe Good Clean Love Hyaluronic acid Hyalofemme replens   Creams to use externally on the Vulva area Desert Harvest Releveum (good for for cancer patients that had radiation to the area)- amazon or [email or web address] V-magic cream - amazon Julva-amazon Vital "V Wild Yam salve ( help moisturize and help with thinning vulvar area, does have Beeswax MoodMaid Botanical Pro-Meno Wild Yam Cream- Amazon Desert Harvest Gele Cleo by Damiva labial moisturizer (Amazon,  Coconut or olive oil aloe Good Clean Love Enchanted Rose by intimate rose  Things to avoid in the vaginal area Do not use things to irritate the vulvar area No lotions just specialized creams for the vulva area- Neogyn, V-magic, No soaps; can use Aveeno or Calendula cleanser if needed. Must be gentle No deodorants No douches Good to sleep without underwear to let the vaginal area to air out No scrubbing: spread the lips to let warm water rinse  over labias and pat dry   Shyrl Obi, PT [facility] [address], [address] , [place] [address] W: [phone] [email or web address]  

## 2023-01-17 ENCOUNTER — Encounter: Payer: BC Managed Care – PPO | Attending: Advanced Practice Midwife | Admitting: Physical Therapy

## 2023-01-17 ENCOUNTER — Encounter: Payer: Self-pay | Admitting: Physical Therapy

## 2023-01-17 DIAGNOSIS — M6281 Muscle weakness (generalized): Secondary | ICD-10-CM | POA: Insufficient documentation

## 2023-01-17 DIAGNOSIS — R1031 Right lower quadrant pain: Secondary | ICD-10-CM | POA: Insufficient documentation

## 2023-01-17 DIAGNOSIS — R252 Cramp and spasm: Secondary | ICD-10-CM | POA: Insufficient documentation

## 2023-01-17 NOTE — Therapy (Signed)
OUTPATIENT PHYSICAL THERAPY FEMALE PELVIC EVALUATION   Patient Name: Katie Crawford MRN: [id number] DOB:11-26-1965, 57 y.o., female Today's Date: 01/17/2023  END OF SESSION:  PT End of Session - 01/17/23 1505     Visit Number 5    Date for PT Re-Evaluation 01/31/23    Authorization Type [employer]    PT Start Time 1500    PT Stop Time 1545    PT Time Calculation (min) 45 min    Activity Tolerance Patient tolerated treatment well    Behavior During Therapy WFL for tasks assessed/performed             Past Medical History:  Diagnosis Date   Cardiomyopathy (HCC)    CHF (congestive heart failure) (HCC)    High cholesterol    Past Surgical History:  Procedure Laterality Date   ABDOMINAL HYSTERECTOMY     CESAREAN SECTION     TUBAL LIGATION     Patient Active Problem List   Diagnosis Date Noted   Status post total abdominal hysterectomy 09/14/2019   Postmenopausal HRT (hormone replacement therapy) 11/23/2011    PCP: Marva Panda, NP  REFERRING PROVIDER: Hurshel Party, CNM   REFERRING DIAG: R10.2 (ICD-10-CM) - Acute pelvic pain, female   THERAPY DIAG:  Cramp and spasm  Muscle weakness (generalized)  Right lower quadrant abdominal pain  Rationale for Evaluation and Treatment: Rehabilitation  ONSET DATE: 2023  SUBJECTIVE:                                                                                                                                                                                           SUBJECTIVE STATEMENT: I have not had issues. Last week I hade pain at level 5/10 after a long flight.  Marland Kitchen      PAIN:  Are you having pain? Yes NPRS scale: 0/10 Pain location:  right lower quadrant  Pain type: aching, dull Pain description: intermittent   Aggravating factors: comes on randomly Relieving factors: heat  PRECAUTIONS: None  WEIGHT BEARING RESTRICTIONS: No  FALLS:  Has patient fallen in last 6 months? No  LIVING  ENVIRONMENT: Lives with: lives with their family  OCCUPATION: Manufacturing systems engineer, chasing toddlers, sitting on the floor; strength training, cardio  PLOF: Independent  PATIENT GOALS: reduce pain  PERTINENT HISTORY:  Hysterectomy 2012; CHF/ Cesarean section   BOWEL MOVEMENT: Pain with bowel movement: No Type of bowel movement:Type (Bristol Stool Scale) Type 4 or 5, Frequency 1-2 days, Strain Yes, and Splinting no Fully empty rectum: No; when she does not drink all her water Leakage: No Fiber supplement: No  URINATION: Pain with urination: No Fully  empty bladder: Yes:   Stream: Strong Urgency: No Frequency: urinate every 2-3 hours Leakage:  none  INTERCOURSE: Pain with intercourse:  when dealing with vaginal dryness   PREGNANCY: C-section deliveries 2  PROLAPSE: None   OBJECTIVE:   DIAGNOSTIC FINDINGS:  none   COGNITION: Overall cognitive status: Within functional limits for tasks assessed     SENSATION: Light touch: Appears intact Proprioception: Appears intact    POSTURE: No Significant postural limitations  PELVIC ALIGNMENT: Correct pelvic alignment  LUMBARAROM/PROM: Lumbar ROM is full   LOWER EXTREMITY ROM:  Passive ROM Right eval Left eval Right  01/17/23 L8/22/2lleft  Hip external rotation 50 60 55 55   (Blank rows = not tested)  LOWER EXTREMITY MMT:  MMT Right eval Left eval Right 01/17/23  Hip flexion 4/5 5/5 5/5  Hip abduction 4/5 5/5 5/5   PALPATION:   General  restrictions in c-section scar, tenderness located in right lower quadrant, tightness in the right hip adductor; able to engage the left abdominal better than the right with SLR 01/17/23 no palpable tenderness                External Perineal Exam intact                             Internal Pelvic Floor tenderness located in the right iliococcygeus and puborectalis  Patient confirms identification and approves PT to assess internal pelvic floor and treatment  Yes  PELVIC MMT:   MMT eval  Vaginal 4/5  (Blank rows = not tested)        TONE: average    TODAY'S TREATMENT:  01/17/23 Manual: Soft tissue mobilization: To assess for dry needling Using the addaday to the right quadriceps to lengthen after dry needling Trigger Point Dry-Needling  Treatment instructions: Expect mild to moderate muscle soreness. S/S of pneumothorax if dry needled over a lung field, and to seek immediate medical attention should they occur. Patient verbalized understanding of these instructions and education.  Patient Consent Given: Yes Education handout provided: Yes Muscles treated: right quadriceps, Electrical stimulation performed: No Parameters: N/A Treatment response/outcome: elongation of muscle and trigger point response Exercises: Strengthening: Discussed with patient her HEP and how to progress with holding weights or changing position to a more challenging pose    11/27/22 Manual: Soft tissue mobilization: Manual work to the right adductor, quadriceps and hamstring to elongate muscles Exercises: Stretches/mobility: Hip adductor stretch moving your foot back and forth then rock hips back and forth Sitting hamstring stretch one leg at a times holding 30 sec bending at hips not at waist Educated patient on vaginal moisturizers, how to put them on the vulvar area and into the vaginal canal.  Strengthening: Stand trunk twist with hip flexion Supine curl up with tactile cues to contract the abdominals with keeping knees together Supine lift leg double, then one leg then just with bent leg    11/20/22 Manual: Soft tissue mobilization: To assess for dry needling Manual work to the right quadratus, right hip adductor and rectus to elongate after dry needling Internal pelvic floor techniques: No emotional/communication barriers or cognitive limitation. Patient is motivated to learn. Patient understands and agrees with treatment goals and plan. PT  explains patient will be examined in standing, sitting, and lying down to see how their muscles and joints work. When they are ready, they will be asked to remove their underwear so PT can examine their perineum. The patient  is also given the option of providing their own chaperone as one is not provided in our facility. The patient also has the right and is explained the right to defer or refuse any part of the evaluation or treatment including the internal exam. With the patient's consent, PT will use one gloved finger to gently assess the muscles of the pelvic floor, seeing how well it contracts and relaxes and if there is muscle symmetry. After, the patient will get dressed and PT and patient will discuss exam findings and plan of care. PT and patient discuss plan of care, schedule, attendance policy and HEP activities.  Going through the vagina and working in the right obturator internist Trigger Point Dry-Needling  Treatment instructions: Expect mild to moderate muscle soreness. S/S of pneumothorax if dry needled over a lung field, and to seek immediate medical attention should they occur. Patient verbalized understanding of these instructions and education.  Patient Consent Given: Yes Education handout provided: Yes Muscles treated: right quadriceps, right hip adductors, right rectus Electrical stimulation performed: No Parameters: N/A Treatment response/outcome: elongation of muscle and trigger point response  Exercises: Stretches/mobility: Educated patient on how to place vaginal moisturizers on the vulva area and to massage in to improve health of tissue.     PATIENT EDUCATION:  11/27/22 Education details: Access Code: [email or web address], how to massage the right pelvic floor muscles, educated patient on vaginal moisturizers Person educated: Patient Education method: Explanation, Demonstration, Tactile cues, Verbal cues, and Handouts Education comprehension: verbalized understanding, returned  demonstration, verbal cues required, tactile cues required, and needs further education  HOME EXERCISE PROGRAM: 11/13/22 Access Code: [email or web address] URL: [email or web address] Date: 11/13/2022 Prepared by: Eulis Foster  Exercises - Half Kneeling Hip Flexor Stretch with Sidebend  - 1 x daily - 7 x weekly - 2 sets - 2 reps - 30 sec hold - Supine Pelvic Tilt with Straight Leg Raise  - 1 x daily - 2 x weekly - 2 sets - 10 reps - Side Plank on Knees  - 1 x daily - 2 x weekly - 5-10 sets - 10 reps - Side Lunge Adductor Stretch  - 1 x daily - 7 x weekly - 1 sets - 5 reps  Patient Education - Trigger Point Dry Needling  ASSESSMENT:  CLINICAL IMPRESSION: Patient is a 57 y.o. female who was seen today for physical therapy  treatment for pelvic pain. She has not had significant pain in right quadrant. She sat on a plane to [place] and had pain to 5/10 but it resolved. She has full strength in the hips. She is independent with her HEP. She had some trigger points in the right quadriceps today but none in the hip adductors. Patient is able to contract her abdominals equally with SLR. She is ready for discharge.    OBJECTIVE IMPAIRMENTS: decreased strength, increased fascial restrictions, increased muscle spasms, and pain.   ACTIVITY LIMITATIONS: locomotion level and exercise  PARTICIPATION LIMITATIONS: community activity  PERSONAL FACTORS: 1-2 comorbidities: c-section, partial hysterectomy  are also affecting patient's functional outcome.   REHAB POTENTIAL: Excellent  CLINICAL DECISION MAKING: Stable/uncomplicated  EVALUATION COMPLEXITY: Low   GOALS: Goals reviewed with patient? Yes  SHORT TERM GOALS: Target date: 12/05/22  Patient is independent with HEP for hip strength and stretches.  Baseline: Goal status: Met 11/13/22  2.  Patient understands how to massage the right pelvic floor internally to reduce trigger points.  Baseline:  Goal status: Met 11/13/22   LONG TERM  GOALS: Target date: 01/31/23  Patient is independent with advanced HEP for hip and core strength to improve stability with her workouts.  Baseline:  Goal status: Met 01/17/23  2.  Patient is able to contract the lower abdominals equally with a straight leg raise to reduce strain during her workouts.  Baseline:  Goal status: Met 01/17/23  3.  Patient reports her right lower quadrant pain decreased to 0-1/10 due to improved strength and reduction of trigger points.  Baseline:  Goal status: Met 01/17/23  4.  Education of vaginal moisturizers to improve vaginal health.  Baseline:  Goal status: met 11/20/22   PLAN: Discharge to HEP today    Eulis Foster, PT 01/17/23 3:50 PM  PHYSICAL THERAPY DISCHARGE SUMMARY  Visits from Start of Care: 5  Current functional level related to goals / functional outcomes: See above.    Remaining deficits: See above.    Education / Equipment: HEP   Patient agrees to discharge. Patient goals were met. Patient is being discharged due to meeting the stated rehab goals. Thank you for the referral.   Eulis Foster, PT 01/17/23 3:50 PM

## 2023-04-09 ENCOUNTER — Encounter: Payer: Self-pay | Admitting: Advanced Practice Midwife

## 2023-04-09 ENCOUNTER — Ambulatory Visit: Payer: BC Managed Care – PPO | Admitting: Advanced Practice Midwife

## 2023-04-09 VITALS — BP 126/79 | HR 63 | Ht 65.0 in | Wt 158.6 lb

## 2023-04-09 DIAGNOSIS — N951 Menopausal and female climacteric states: Secondary | ICD-10-CM | POA: Diagnosis not present

## 2023-04-09 NOTE — Progress Notes (Signed)
   GYNECOLOGY PROGRESS NOTE  History:  57 y.o. G3P2010 presents to [facility] office today for follow up gyn visit.  She is s/p abdominal hysterectomy in 2012 with menopause symptoms since surgery but improved for a few years with HRT and now resumed.  She has night sweats, irritability, and insomnia.  She reports improved sleep and mood with estrogen patch but vaginal dryness and painful intercourse are unchanged.  She is interested in vaginal   She denies h/a, dizziness, shortness of breath, n/v, or fever/chills.    The following portions of the patient's history were reviewed and updated as appropriate: allergies, current medications, past family history, past medical history, past social history, past surgical history and problem list. Last pap smear in 2016 prior to hysterectomy was normal.  Health Maintenance Due  Topic Date Due   HIV Screening  Never done   Hepatitis C Screening  Never done   DTaP/Tdap/Td (1 - Tdap) Never done   Zoster Vaccines- Shingrix (1 of 2) Never done   Colonoscopy  Never done   COVID-19 Vaccine (3 - Moderna risk series) 10/08/2019   INFLUENZA VACCINE  Never done     Review of Systems:  Pertinent items are noted in HPI.   Objective:  Physical Exam Blood pressure 126/79, pulse 63, height 5\' 5"  (1.651 m), weight 158 lb 9.6 oz (71.9 kg). VS reviewed, nursing note reviewed,  Constitutional: well developed, well nourished, no distress HEENT: normocephalic CV: normal rate Pulm/chest wall: normal effort Breast Exam: deferred Abdomen: soft Neuro: alert and oriented x 3 Skin: warm, dry Psych: affect normal Pelvic exam: Deferred  Assessment & Plan:  1. Vasomotor symptoms due to menopause -- Initiated estradiol 0.025 mg/24 hour patch to use twice per week, starting 09/10/22 with improved symptoms except for vaginal dryness/pain.  - CBC at pt request  2. Menopausal vaginal dryness --Discussed vaginal estrogen but pt with improvement in systemic symptoms  from patch so desires to do both.  Consult Dr Jolayne Panther with assessment and findings and either vaginal estrogen alone, or continue low dose patch and add silicone based lubricant for intercourse, coconut oil every night for personal vaginal moisturizer.   --Pt to decide which options she prefers and contact CNM --F/U in 1 year for annual exam   No follow-ups on file.   Sharen Counter, CNM 6:20 PM

## 2023-04-09 NOTE — Progress Notes (Signed)
Pt. Presents for Hormone replacement. Wants to get her hormone levels drawn.

## 2023-04-10 LAB — CBC
Hematocrit: 43.9 % (ref 34.0–46.6)
Hemoglobin: 14.1 g/dL (ref 11.1–15.9)
MCH: 29 pg (ref 26.6–33.0)
MCHC: 32.1 g/dL (ref 31.5–35.7)
MCV: 90 fL (ref 79–97)
Platelets: 213 10*3/uL (ref 150–450)
RBC: 4.86 x10E6/uL (ref 3.77–5.28)
RDW: 12.4 % (ref 11.7–15.4)
WBC: 9.1 10*3/uL (ref 3.4–10.8)

## 2023-04-12 ENCOUNTER — Other Ambulatory Visit: Payer: Self-pay | Admitting: Advanced Practice Midwife

## 2023-04-12 DIAGNOSIS — N951 Menopausal and female climacteric states: Secondary | ICD-10-CM

## 2023-04-12 MED ORDER — ESTRADIOL 0.1 MG/GM VA CREA
TOPICAL_CREAM | VAGINAL | 12 refills | Status: AC
Start: 2023-04-12 — End: ?

## 2023-04-12 NOTE — Progress Notes (Signed)
Pt sent MyChart message and would prefer to try vaginal estrogen and discontinue the estrogen patch.  Rx for Estrace cream 0.1 mg/gm sent to pharmacy.  F/U in office in 3 months.

## 2023-06-14 ENCOUNTER — Other Ambulatory Visit: Payer: Self-pay | Admitting: *Deleted

## 2023-06-14 DIAGNOSIS — Z1231 Encounter for screening mammogram for malignant neoplasm of breast: Secondary | ICD-10-CM

## 2023-07-01 ENCOUNTER — Ambulatory Visit
Admission: RE | Admit: 2023-07-01 | Discharge: 2023-07-01 | Disposition: A | Discharge status: Home / Self Care | Payer: 59 | Source: Ambulatory Visit | Attending: *Deleted | Admitting: *Deleted

## 2023-07-01 DIAGNOSIS — Z1231 Encounter for screening mammogram for malignant neoplasm of breast: Secondary | ICD-10-CM
